# Patient Record
Sex: Male | Born: 1965
Health system: Southern US, Community
[De-identification: ages and names within clinical notes are randomized; demographics above are authoritative.]

## PROBLEM LIST (undated history)

## (undated) DIAGNOSIS — I10 Essential (primary) hypertension: Secondary | ICD-10-CM

## (undated) HISTORY — PX: NO PAST SURGERIES: SHX2092

---

## 2005-05-25 ENCOUNTER — Emergency Department (HOSPITAL_COMMUNITY): Admission: EM | Admit: 2005-05-25 | Discharge: 2005-05-25 | Payer: Self-pay | Admitting: Family Medicine

## 2008-06-20 ENCOUNTER — Emergency Department (HOSPITAL_COMMUNITY): Admission: EM | Admit: 2008-06-20 | Discharge: 2008-06-20 | Payer: Self-pay | Admitting: Emergency Medicine

## 2017-10-20 ENCOUNTER — Ambulatory Visit (HOSPITAL_COMMUNITY)
Admission: EM | Admit: 2017-10-20 | Discharge: 2017-10-20 | Disposition: A | Discharge status: Home / Self Care | Payer: Self-pay | Attending: Radiology | Admitting: Radiology

## 2017-10-20 ENCOUNTER — Encounter (HOSPITAL_COMMUNITY): Payer: Self-pay | Admitting: Emergency Medicine

## 2017-10-20 DIAGNOSIS — J069 Acute upper respiratory infection, unspecified: Secondary | ICD-10-CM

## 2017-10-20 HISTORY — DX: Essential (primary) hypertension: I10

## 2017-10-20 MED ORDER — DEXAMETHASONE SODIUM PHOSPHATE 10 MG/ML IJ SOLN
INTRAMUSCULAR | Status: AC
  Filled 2017-10-20: qty 1

## 2017-10-20 MED ORDER — BENZONATATE 100 MG PO CAPS: 100.0000 mg | ORAL_CAPSULE | Freq: Three times a day (TID) | ORAL | 0 refills | Status: DC

## 2017-10-20 MED ORDER — AMOXICILLIN-POT CLAVULANATE 875-125 MG PO TABS: 1.0000 | ORAL_TABLET | Freq: Two times a day (BID) | ORAL | 0 refills | Status: DC

## 2017-10-20 MED ORDER — DEXAMETHASONE SODIUM PHOSPHATE 10 MG/ML IJ SOLN
10.0000 mg | Freq: Once | INTRAMUSCULAR | Status: AC
  Administered 2017-10-20: 10 mg via INTRAMUSCULAR

## 2017-10-20 NOTE — ED Triage Notes (Signed)
Pt sts URI sx x 1 week with cough and congestion; pt requesting work note; pt with hx of htn not taking meds currently

## 2017-10-20 NOTE — ED Provider Notes (Signed)
[facility]    CSN: [id number] Arrival date & time: 10/20/17  1209     History   Chief Complaint Chief Complaint  Patient presents with  . URI    HPI David Pennington is a 51 y.o. male.   51 y.o. male presents with URI symptoms, cough, congestion, headache and subjective fever X 4 days.  Condition is acute in nature. Condition is made better by nothing. Condition is made worse by laying down. Patient denies any relief from otc medications taken prior to there arrival at this facility. Patient denies any nausea, vomitting, diarrhea or sick contacts      Past Medical History:  Diagnosis Date  . Hypertension     There are no active problems to display for this patient.   History reviewed. No pertinent surgical history.     Home Medications    Prior to Admission medications   Not on File    Family History History reviewed. No pertinent family history.  Social History Social History   Tobacco Use  . Smoking status: Unknown If Ever Smoked  . Smokeless tobacco: Never Used  Substance Use Topics  . Alcohol use: No    Frequency: Never  . Drug use: No     Allergies   Patient has no known allergies.   Review of Systems Review of Systems  Constitutional: Positive for fever. Negative for chills.  HENT: Positive for congestion. Negative for ear pain and sore throat.   Eyes: Negative for pain and visual disturbance.  Respiratory: Positive for cough ( ). Negative for shortness of breath.   Cardiovascular: Negative for chest pain and palpitations.  Gastrointestinal: Negative for abdominal pain and vomiting.  Genitourinary: Negative for dysuria and hematuria.  Musculoskeletal: Negative for arthralgias and back pain.  Skin: Negative for color change and rash.  Neurological: Positive for headaches. Negative for seizures and syncope.  All other systems reviewed and are negative.    Physical Exam Triage Vital Signs ED Triage Vitals [10/20/17 1243]   Enc Vitals Group     BP (!) 207/101     Pulse Rate 65     Resp 18     Temp 98.2 F (36.8 C)     Temp Source Oral     SpO2 99 %     Weight      Height      Head Circumference      Peak Flow      Pain Score      Pain Loc      Pain Edu?      Excl. in [place]?    No data found.  Updated Vital Signs BP (!) 207/101 (BP Location: Left Arm)   Pulse 65   Temp 98.2 F (36.8 C) (Oral)   Resp 18   SpO2 99%   Visual Acuity Right Eye Distance:   Left Eye Distance:   Bilateral Distance:    Right Eye Near:   Left Eye Near:    Bilateral Near:     Physical Exam  Constitutional: He is oriented to person, place, and time. He appears well-developed and well-nourished.  HENT:  Head: Normocephalic.  erthyema noted to pillars, congested noted to bilateral nares.   Neck: Normal range of motion.  Cardiovascular: Normal rate and regular rhythm.  Pulmonary/Chest: Effort normal.  Musculoskeletal: Normal range of motion.  Neurological: He is alert and oriented to person, place, and time.  Skin: Skin is dry.  Psychiatric: He has a normal mood  and affect.  Nursing note and vitals reviewed.    UC Treatments / Results  Labs (all labs ordered are listed, but only abnormal results are displayed) Labs Reviewed - No data to display  EKG  EKG Interpretation None       Radiology No results found.  Procedures Procedures (including critical care time)  Medications Ordered in UC Medications  dexamethasone (DECADRON) injection 10 mg (not administered)     Initial Impression / Assessment and Plan / UC Course  I have reviewed the triage vital signs and the nursing notes.  Pertinent labs & imaging results that were available during my care of the patient were reviewed by me and considered in my medical decision making (see chart for details).       Final Clinical Impressions(s) / UC Diagnoses   Final diagnoses:  None    ED Discharge Orders    None       Controlled  Substance Prescriptions [place] Controlled Substance Registry consulted? Not Applicable   Jacqualine Mau, NP 10/20/17 1324

## 2017-10-20 NOTE — Discharge Instructions (Signed)
Continue to push fluids and take over the counter medications as directed on the back of the box for symptomatic relief.  Start antibiotics if no better in 3-4 days.

## 2019-03-31 ENCOUNTER — Encounter (HOSPITAL_COMMUNITY): Payer: Self-pay

## 2019-03-31 ENCOUNTER — Other Ambulatory Visit: Payer: Self-pay

## 2019-03-31 ENCOUNTER — Ambulatory Visit (HOSPITAL_COMMUNITY)
Admission: EM | Admit: 2019-03-31 | Discharge: 2019-03-31 | Disposition: A | Discharge status: Home / Self Care | Payer: PRIVATE HEALTH INSURANCE | Attending: Family Medicine | Admitting: Family Medicine

## 2019-03-31 DIAGNOSIS — R1031 Right lower quadrant pain: Secondary | ICD-10-CM

## 2019-03-31 DIAGNOSIS — R1032 Left lower quadrant pain: Secondary | ICD-10-CM

## 2019-03-31 MED ORDER — DICYCLOMINE HCL 20 MG PO TABS: 20.0000 mg | ORAL_TABLET | Freq: Two times a day (BID) | ORAL | 0 refills | Status: DC

## 2019-03-31 NOTE — Discharge Instructions (Signed)
No alarming signs on exam. Keep hydrated, urine should be clear to pale yellow in color. Decrease milk intake. If starting to have diarrheal like bowel movement, can try bentyl to see if it helps with symptoms. If symptoms continues without worsening, follow up with PCP for further evaluation. If noticing worsening symptoms, worsening abdominal pain, unable to walk or jump due to pain, nausea/vomiting, fever, go to the emergency department for further evaluation needed.

## 2019-03-31 NOTE — ED Provider Notes (Signed)
[place]    CSN: [id number] Arrival date & time: 03/31/19  1210     History   Chief Complaint Chief Complaint  Patient presents with  . Abdominal Pain    HPI David Pennington is a 53 y.o. male.   53 year old male comes in for 6-day history of bilateral lower abdominal pain.  Pain is intermittent, cramping in sensation.  States that usually last about 5 to 10 minutes.  Oral intake can exacerbate the pain, and putting pressure to the abdomen can relieve the pain.  Has had decreased appetite without nausea or vomiting.  States still able to eat and drink despite the pain.  Denies fever, chills, night sweats.  Denies urinary symptoms such as frequency, dysuria, hematuria.  History of stomach ulcers, and does not take any NSAIDs.  He denies melena, hematochezia.  He has frequent alcohol use, but has decreased due to recent changes in work schedule.  He has also been drinking more milk than normal.  He denies history of abdominal surgeries.  Has daily bowel movements without obvious straining.  States stools are slightly looser today, but no obvious diarrhea.     Past Medical History:  Diagnosis Date  . Hypertension     There are no active problems to display for this patient.   History reviewed. No pertinent surgical history.     Home Medications    Prior to Admission medications   Medication Sig Start Date End Date Taking? Authorizing Provider  dicyclomine (BENTYL) 20 MG tablet Take 1 tablet (20 mg total) by mouth 2 (two) times daily. 03/31/19   Ok Edwards, PA-C    Family History History reviewed. No pertinent family history.  Social History Social History   Tobacco Use  . Smoking status: Unknown If Ever Smoked  . Smokeless tobacco: Never Used  Substance Use Topics  . Alcohol use: No    Frequency: Never  . Drug use: No     Allergies   Patient has no known allergies.   Review of Systems Review of Systems  Reason unable to perform ROS: See HPI as  above.     Physical Exam Triage Vital Signs ED Triage Vitals  Enc Vitals Group     BP 03/31/19 1221 (!) 161/115     Pulse Rate 03/31/19 1221 77     Resp 03/31/19 1221 20     Temp 03/31/19 1221 98 F (36.7 C)     Temp Source 03/31/19 1221 Oral     SpO2 03/31/19 1221 98 %     Weight --      Height --      Head Circumference --      Peak Flow --      Pain Score 03/31/19 1223 5     Pain Loc --      Pain Edu? --      Excl. in [place]? --    No data found.  Updated Vital Signs BP (!) 161/115   Pulse 77   Temp 98 F (36.7 C) (Oral)   Resp 20   SpO2 98%   Physical Exam Constitutional:      General: He is not in acute distress.    Appearance: He is well-developed.  HENT:     Head: Normocephalic and atraumatic.  Cardiovascular:     Rate and Rhythm: Normal rate and regular rhythm.     Heart sounds: Normal heart sounds. No murmur. No friction rub. No gallop.   Pulmonary:  Effort: Pulmonary effort is normal.     Breath sounds: Normal breath sounds. No wheezing or rales.  Abdominal:     General: Bowel sounds are normal.     Palpations: Abdomen is soft.     Tenderness: There is no abdominal tenderness. There is no right CVA tenderness, left CVA tenderness, guarding or rebound.  Skin:    General: Skin is warm and dry.  Neurological:     Mental Status: He is alert and oriented to person, place, and time.  Psychiatric:        Behavior: Behavior normal.        Judgment: Judgment normal.      UC Treatments / Results  Labs (all labs ordered are listed, but only abnormal results are displayed) Labs Reviewed - No data to display  EKG None  Radiology No results found.  Procedures Procedures (including critical care time)  Medications Ordered in UC Medications - No data to display  Initial Impression / Assessment and Plan / UC Course  I have reviewed the triage vital signs and the nursing notes.  Pertinent labs & imaging results that were available during my care  of the patient were reviewed by me and considered in my medical decision making (see chart for details).    Abdomen nontender to palpation.  Will have patient continue to monitor symptoms, decrease milk intake.  Push fluids.  If continues with looser stools, or develop diarrhea, can start Bentyl for symptomatic relief.  Return precautions given.  Patient expresses understanding and agrees to plan.  Final Clinical Impressions(s) / UC Diagnoses   Final diagnoses:  Bilateral lower abdominal pain    ED Prescriptions    Medication Sig Dispense Auth. Provider   dicyclomine (BENTYL) 20 MG tablet Take 1 tablet (20 mg total) by mouth 2 (two) times daily. 20 tablet Tobin Chad, [place] 03/31/19 1305

## 2019-03-31 NOTE — ED Triage Notes (Signed)
C/o lower abdominal pain since last Wednesday, pain is intermittent, denies N/V , diarrhea or constipation

## 2019-04-17 ENCOUNTER — Ambulatory Visit (INDEPENDENT_AMBULATORY_CARE_PROVIDER_SITE_OTHER): Payer: PRIVATE HEALTH INSURANCE

## 2019-04-17 ENCOUNTER — Other Ambulatory Visit: Payer: Self-pay

## 2019-04-17 ENCOUNTER — Ambulatory Visit (HOSPITAL_COMMUNITY)
Admission: EM | Admit: 2019-04-17 | Discharge: 2019-04-17 | Disposition: A | Discharge status: Home / Self Care | Payer: PRIVATE HEALTH INSURANCE | Attending: Family Medicine | Admitting: Family Medicine

## 2019-04-17 ENCOUNTER — Encounter (HOSPITAL_COMMUNITY): Payer: Self-pay

## 2019-04-17 DIAGNOSIS — R1032 Left lower quadrant pain: Secondary | ICD-10-CM | POA: Diagnosis present

## 2019-04-17 DIAGNOSIS — R319 Hematuria, unspecified: Secondary | ICD-10-CM

## 2019-04-17 DIAGNOSIS — R634 Abnormal weight loss: Secondary | ICD-10-CM | POA: Diagnosis not present

## 2019-04-17 LAB — COMPREHENSIVE METABOLIC PANEL
ALT: 17 U/L (ref 0–44)
AST: 20 U/L (ref 15–41)
Albumin: 4.5 g/dL (ref 3.5–5.0)
Alkaline Phosphatase: 54 U/L (ref 38–126)
Anion gap: 12 (ref 5–15)
BUN: 7 mg/dL (ref 6–20)
CO2: 29 mmol/L (ref 22–32)
Calcium: 10.4 mg/dL — ABNORMAL HIGH (ref 8.9–10.3)
Chloride: 97 mmol/L — ABNORMAL LOW (ref 98–111)
Creatinine, Ser: 1.1 mg/dL (ref 0.61–1.24)
GFR calc Af Amer: >60 mL/min (ref >60)
GFR calc non Af Amer: >60 mL/min (ref >60)
Glucose, Bld: 100 mg/dL — ABNORMAL HIGH (ref 70–99)
Potassium: 4.4 mmol/L (ref 3.5–5.1)
Sodium: 138 mmol/L (ref 135–145)
Total Bilirubin: 0.4 mg/dL (ref 0.3–1.2)
Total Protein: 8 g/dL (ref 6.5–8.1)

## 2019-04-17 LAB — POCT URINALYSIS DIP (DEVICE)
Bilirubin Urine: NEGATIVE
Glucose, UA: NEGATIVE mg/dL
Ketones, ur: NEGATIVE mg/dL
Leukocytes,Ua: NEGATIVE
Nitrite: NEGATIVE
Protein, ur: 30 mg/dL — AB
Specific Gravity, Urine: 1.02 (ref 1.005–1.030)
Urobilinogen, UA: 0.2 mg/dL (ref 0.0–1.0)
pH: 6 (ref 5.0–8.0)

## 2019-04-17 LAB — CBC
HCT: 45.8 % (ref 39.0–52.0)
Hemoglobin: 15.9 g/dL (ref 13.0–17.0)
MCH: 32.6 pg (ref 26.0–34.0)
MCHC: 34.7 g/dL (ref 30.0–36.0)
MCV: 93.9 fL (ref 80.0–100.0)
Platelets: 317 10*3/uL (ref 150–400)
RBC: 4.88 MIL/uL (ref 4.22–5.81)
RDW: 13.2 % (ref 11.5–15.5)
WBC: 5.7 10*3/uL (ref 4.0–10.5)
nRBC: 0 % (ref 0.0–0.2)

## 2019-04-17 LAB — AMYLASE: Amylase: 92 U/L (ref 28–100)

## 2019-04-17 MED ORDER — POLYETHYLENE GLYCOL 3350 17 G PO PACK: 17.0000 g | PACK | Freq: Every day | ORAL | 0 refills | Status: DC

## 2019-04-17 NOTE — Discharge Instructions (Addendum)
You need to drink more water Stop drinking milk.  Switch to a nondairy milk like almond milk, or try Lactaid milk Try to reduce your drinking of soda I am prescribing a medicine to soften your stool so you will have regular bowel movements and not become constipated You need to follow-up with your primary care doctor for preventative medicine.  I have also given you the phone number of a gastroenterology (stomach) Dr.

## 2019-04-17 NOTE — ED Triage Notes (Signed)
Pt states that he is still having abdominal pain with no improvement since last visit

## 2019-04-17 NOTE — ED Provider Notes (Signed)
[place]    CSN: [id number] Arrival date & time: 04/17/19  1117     History   Chief Complaint Chief Complaint  Patient presents with  . Abdominal Pain    HPI David Pennington is a 53 y.o. male.   HPI    This is a pleasant 53 year old gentleman who works at [employer] and [employer].  He is here for follow-up of abdominal pain.  He was seen here 03/31/2019 for lower abdominal pain.  He is here for ongoing pain.  Is been more than a month.  He states he is lost weight.  He feels like his appetite is okay.  He has not any alcohol for the last month.  He does drink a lot of soda He does drink a lot of milk.  He tried to cut back on his milk.  He does not drink any water.  He is a single man.  He states he eats a lot of fried food Cannot feel that there is any relationship to eating or food.  He states that he has more pain during his waking hours and when he is sleeping.  The pain does not awaken him at night.  First he told me that he had regular bowel movements, but then he told me he has constipation and occasionally takes laxatives.  Blood in bowels.  No nausea or vomiting.  No fever or chills. He is 53 years old he does not get any regular medical care.  He has not had any preventative medicine or cancer screening.  He has not had a colonoscopy.  He denies that there is any colon cancer in his family. He states he smokes cigarettes. He does not have any kidney stones or kidney pain.  No problem with urination.  He is not noted any blood in his urine.   Past Medical History:  Diagnosis Date  . Hypertension     There are no active problems to display for this patient.   History reviewed. No pertinent surgical history.     Home Medications    Prior to Admission medications   Medication Sig Start Date End Date Taking? Authorizing Provider  polyethylene glycol (MIRALAX / GLYCOLAX) 17 g packet Take 17 g by mouth daily. 04/17/19   Raylene Everts, MD    Family History  History reviewed. No pertinent family history.  Social History Social History   Tobacco Use  . Smoking status: Unknown If Ever Smoked  . Smokeless tobacco: Never Used  Substance Use Topics  . Alcohol use: No    Frequency: Never  . Drug use: No     Allergies   Patient has no known allergies.   Review of Systems Review of Systems  Constitutional: Negative for chills and fever.  HENT: Negative for ear pain and sore throat.   Eyes: Negative for pain and visual disturbance.  Respiratory: Negative for cough and shortness of breath.   Cardiovascular: Negative for chest pain and palpitations.  Gastrointestinal: Positive for abdominal pain and constipation. Negative for vomiting.  Genitourinary: Negative for difficulty urinating, dysuria and hematuria.  Musculoskeletal: Negative for arthralgias, back pain and myalgias.  Skin: Negative for color change and rash.  Neurological: Negative for seizures, syncope, weakness and headaches.  All other systems reviewed and are negative.    Physical Exam Triage Vital Signs ED Triage Vitals  Enc Vitals Group     BP 04/17/19 1215 (!) 151/94     Pulse Rate 04/17/19 1215 70  Resp --      Temp 04/17/19 1215 98.2 F (36.8 C)     Temp src --      SpO2 04/17/19 1215 100 %     Weight --      Height --      Head Circumference --      Peak Flow --      Pain Score 04/17/19 1216 5     Pain Loc --      Pain Edu? --      Excl. in [place]? --    No data found.  Updated Vital Signs BP (!) 151/94   Pulse 70   Temp 98.2 F (36.8 C)   SpO2 100%         Physical Exam Constitutional:      General: He is not in acute distress.    Appearance: He is well-developed and normal weight.     Comments: Appears thin  HENT:     Head: Normocephalic and atraumatic.     Mouth/Throat:     Mouth: Mucous membranes are moist.  Eyes:     Extraocular Movements: Extraocular movements intact.     Conjunctiva/sclera: Conjunctivae normal.     Pupils: Pupils  are equal, round, and reactive to light.  Neck:     Musculoskeletal: Normal range of motion.  Cardiovascular:     Rate and Rhythm: Normal rate and regular rhythm.     Heart sounds: Normal heart sounds.  Pulmonary:     Effort: Pulmonary effort is normal. No respiratory distress.     Breath sounds: Normal breath sounds.  Abdominal:     General: Abdomen is flat and scaphoid. Bowel sounds are normal. There is no distension.     Palpations: Abdomen is soft. There is no hepatomegaly or splenomegaly.     Tenderness: There is abdominal tenderness in the left lower quadrant. There is no guarding or rebound.     Hernia: No hernia is present.  Musculoskeletal: Normal range of motion.  Skin:    General: Skin is warm and dry.  Neurological:     General: No focal deficit present.     Mental Status: He is alert.  Psychiatric:        Mood and Affect: Mood normal.        Behavior: Behavior normal.    Tenderness to palpation in the deep left abdomen.  No masses palpable.  UC Treatments / Results  Labs (all labs ordered are listed, but only abnormal results are displayed) Labs Reviewed  COMPREHENSIVE METABOLIC PANEL - Abnormal; Notable for the following components:      Result Value   Chloride 97 (*)    Glucose, Bld 100 (*)    Calcium 10.4 (*)    All other components within normal limits  POCT URINALYSIS DIP (DEVICE) - Abnormal; Notable for the following components:   Hgb urine dipstick LARGE (*)    Protein, ur 30 (*)    All other components within normal limits  CBC  AMYLASE    EKG None  Radiology Dg Abdomen 1 View  Result Date: 04/17/2019 CLINICAL DATA:  Per pt: mid stomach pain x 2 weeks. Not getting any better. No previous injury or surgery. ab pain X1 month, LLQ, weight loss EXAM: ABDOMEN - 1 VIEW COMPARISON:  None. FINDINGS: No dilated loops of large or small bowel. High-density material in the RIGHT lower quadrant is favored stool contents. No pathologic calcifications. No  organomegaly. No aggressive osseous lesion. No intraperitoneal  free air. IMPRESSION: No acute abdominal findings.  No bowel obstruction. Electronically Signed   By: Suzy Bouchard M.D.   On: 04/17/2019 13:25    Procedures Procedures (including critical care time)  Medications Ordered in UC Medications - No data to display  Initial Impression / Assessment and Plan / UC Course  I have reviewed the triage vital signs and the nursing notes.  Pertinent labs & imaging results that were available during my care of the patient were reviewed by me and considered in my medical decision making (see chart for details).      I discussed with the patient that I did not find any abnormalities to explain his recurrent abdominal pain. The fact he is having crampy abdominal pain, intermittent constipation, and loss of weight is very concerning.  Especially since he has had no colon cancer screening and he is a smoker.  I believe he needs additional evaluation and possible CAT scan.  We discussed that he needed BCP.  He needs referral to GI. In addition the patient has hematuria.  This is not expected.  This does need to be followed up and evaluated. Final Clinical Impressions(s) / UC Diagnoses   Final diagnoses:  Left lower quadrant abdominal pain  Loss of weight  Hematuria, unspecified type     Discharge Instructions     You need to drink more water Stop drinking milk.  Switch to a nondairy milk like almond milk, or try Lactaid milk Try to reduce your drinking of soda I am prescribing a medicine to soften your stool so you will have regular bowel movements and not become constipated You need to follow-up with your primary care doctor for preventative medicine.  I have also given you the phone number of a gastroenterology (stomach) Dr.    ED Prescriptions    Medication Sig Dispense Auth. Provider   polyethylene glycol (MIRALAX / GLYCOLAX) 17 g packet Take 17 g by mouth daily. 2 each Raylene Everts, MD     Controlled Substance Prescriptions [place] Controlled Substance Registry consulted? Not Applicable   Raylene Everts, MD 04/17/19 [phone]

## 2019-05-07 ENCOUNTER — Emergency Department (HOSPITAL_COMMUNITY)
Admission: EM | Admit: 2019-05-07 | Discharge: 2019-05-07 | Disposition: A | Discharge status: Home / Self Care | Payer: PRIVATE HEALTH INSURANCE | Attending: Emergency Medicine | Admitting: Emergency Medicine

## 2019-05-07 ENCOUNTER — Encounter (HOSPITAL_COMMUNITY): Payer: Self-pay

## 2019-05-07 ENCOUNTER — Other Ambulatory Visit: Payer: Self-pay

## 2019-05-07 DIAGNOSIS — R1084 Generalized abdominal pain: Secondary | ICD-10-CM | POA: Diagnosis present

## 2019-05-07 DIAGNOSIS — I10 Essential (primary) hypertension: Secondary | ICD-10-CM | POA: Diagnosis not present

## 2019-05-07 MED ORDER — HYDROCHLOROTHIAZIDE 12.5 MG PO TABS: 12.5000 mg | ORAL_TABLET | Freq: Every day | ORAL | 0 refills | Status: DC

## 2019-05-07 MED ORDER — SODIUM CHLORIDE 0.9% FLUSH: 3.0000 mL | Freq: Once | INTRAVENOUS | Status: DC

## 2019-05-07 NOTE — ED Notes (Signed)
Patient left ED prior to discharge paperwork, but was told he was being discharged.  Patient left.  Paperwork placed at front  To be pciked up.

## 2019-05-07 NOTE — Discharge Instructions (Signed)
Please read attached information. If you experience any new or worsening signs or symptoms please return to the emergency room for evaluation. Please follow-up with your primary care provider or specialist as discussed. Please use medication prescribed only as directed and discontinue taking if you have any concerning signs or symptoms.   °

## 2019-05-07 NOTE — ED Provider Notes (Signed)
[facility]PART[facility] Provider Note   CSN: [id number] Arrival date & time: 05/07/19  1121    History   Chief Complaint Chief Complaint  Patient presents with  . Abdominal Pain    HPI David Pennington is a 53 y.o. male.     HPI   53 year old male presents today with complaints of abdominal pain.  He notes that over the last month he has had intermittent lower abdominal pain.  He notes this is crampy in nature.  He notes that sometimes when he drinks water or eats food the pain comes on and lasts approximately 10 minutes.  He notes complete resolution of symptoms.  He denies any associated nausea vomiting fever diarrhea or constipation.  He notes no urinary symptoms.  He notes that this is not every time he eats or drinks but is an occasional.  He denies any history of the same.  He denies any pain presently, no pain with palpation of his abdomen.  He reports he has been seen before and placed on Bentyl which did not improve his symptoms.  He is attempted to follow-up with gastroenterology but does not have a smart phone so he cannot participate in the visit.  Notes a history of hypertension but has run out of the medications that were given to him.  He denies any chest pain vision changes headache or any other blood pressure related concerns.  Past Medical History:  Diagnosis Date  . Hypertension     There are no active problems to display for this patient.   No past surgical history on file.      Home Medications    Prior to Admission medications   Medication Sig Start Date End Date Taking? Authorizing Provider  hydrochlorothiazide (HYDRODIURIL) 12.5 MG tablet Take 1 tablet (12.5 mg total) by mouth daily. 05/07/19   Arad Burston, Dellis Filbert, PA-C  polyethylene glycol (MIRALAX / GLYCOLAX) 17 g packet Take 17 g by mouth daily. 04/17/19   Raylene Everts, MD    Family History No family history on file.  Social History Social History   Tobacco Use  .  Smoking status: Unknown If Ever Smoked  . Smokeless tobacco: Never Used  Substance Use Topics  . Alcohol use: No    Frequency: Never  . Drug use: No     Allergies   Patient has no known allergies.   Review of Systems Review of Systems  All other systems reviewed and are negative.    Physical Exam Updated Vital Signs BP (!) 180/71 (BP Location: Right Arm)   Pulse 77   Temp 98 F (36.7 C) (Oral)   Resp 20   Ht 6\' 5"  (1.956 m)   Wt 74.8 kg   SpO2 100%   BMI 19.57 kg/m   Physical Exam Vitals signs and nursing note reviewed.  Constitutional:      Appearance: He is well-developed.  HENT:     Head: Normocephalic and atraumatic.  Eyes:     General: No scleral icterus.       Right eye: No discharge.        Left eye: No discharge.     Conjunctiva/sclera: Conjunctivae normal.     Pupils: Pupils are equal, round, and reactive to light.  Neck:     Musculoskeletal: Normal range of motion.     Vascular: No JVD.     Trachea: No tracheal deviation.  Pulmonary:     Effort: Pulmonary effort is normal.  Breath sounds: No stridor.  Abdominal:     Comments: Abdomen is soft nontender with no distention or masses  Neurological:     Mental Status: He is alert and oriented to person, place, and time.     Coordination: Coordination normal.  Psychiatric:        Behavior: Behavior normal.        Thought Content: Thought content normal.        Judgment: Judgment normal.      ED Treatments / Results  Labs (all labs ordered are listed, but only abnormal results are displayed) Labs Reviewed - No data to display  EKG None  Radiology No results found.  Procedures Procedures (including critical care time)  Medications Ordered in ED Medications - No data to display   Initial Impression / Assessment and Plan / ED Course  I have reviewed the triage vital signs and the nursing notes.  Pertinent labs & imaging results that were available during my care of the patient  were reviewed by me and considered in my medical decision making (see chart for details).      Assessment/Plan: 53 year old male presents today with abdominal pain.  Unknown certain etiology at this time.  I have low suspicion for acute life-threatening etiology including vascular, infection, or malignancy.  Question irritable bowel.  Patient also hypertensive here, he will be placed back on HCTZ, labs from earlier in the month assuring.  Patient will continue outpatient gastroenterology follow-up, follow-up with primary care provider and return immediately if develops any new or worsening signs or symptoms.  He verbalized understanding and agreement to today's plan had no further questions or concerns.  Patient also requesting a return to work note.  He notes because of his abdominal pain they have kept him out with concerns for COVID.  He denies any concerns related to coronavirus      Final Clinical Impressions(s) / ED Diagnoses   Final diagnoses:  Generalized abdominal pain  Hypertension, unspecified type    ED Discharge Orders         Ordered    hydrochlorothiazide (HYDRODIURIL) 12.5 MG tablet  Daily     05/07/19 1145           Okey Regal, PA-C 05/07/19 1147    Veryl Speak, MD 05/07/19 1447

## 2019-05-07 NOTE — ED Triage Notes (Signed)
Pt presents to ED with bilateral lower abdominal x1 month.  Pt reports being seen at [facility] x2 and they gave him information to a GI specialist who hasn't yet made an appointment.    Today pt reports that the pain is intermittent and has resolved at time of triage.  Pt denies N/V/D and constipation.

## 2019-05-27 ENCOUNTER — Other Ambulatory Visit: Payer: Self-pay

## 2019-05-27 ENCOUNTER — Ambulatory Visit (INDEPENDENT_AMBULATORY_CARE_PROVIDER_SITE_OTHER): Payer: PRIVATE HEALTH INSURANCE | Admitting: Gastroenterology

## 2019-05-27 ENCOUNTER — Encounter: Payer: Self-pay | Admitting: Gastroenterology

## 2019-05-27 VITALS — Ht 77.0 in | Wt 165.0 lb

## 2019-05-27 DIAGNOSIS — R1030 Lower abdominal pain, unspecified: Secondary | ICD-10-CM

## 2019-05-27 MED ORDER — NA SULFATE-K SULFATE-MG SULF 17.5-3.13-1.6 GM/177ML PO SOLN: 1.0000 | ORAL | 0 refills | Status: AC

## 2019-05-27 NOTE — Progress Notes (Signed)
TELEHEALTH VISIT  Referring Provider: No ref. provider found Primary Care Physician:  System, Provider Not In   Tele-visit due to COVID-19 pandemic Patient requested visit virtually, consented to the virtual encounter via audio enabled telemedicine application (failed attempt at [place], failed attempt at video [place], converted to audio) Contact made at: 16:16 05/27/19 Patient verified by name and date of birth Location of patient: Niece's house Location provider: [facility] Names of persons participating: Me, patient, David Pennington CMA Time spent on telehealth visit: 30 minutes I discussed the limitations of evaluation and management by telemedicine. The patient expressed understanding and agreed to proceed.  Reason for Consultation:  Abdominal pain   IMPRESSION:  Lower abdominal pain    - negative Carnett's sign (completed during telehealth encounter) No prior colon cancer screening  No known family history of colon cancer or polyps  Etiology of abdominal pain is unclear. Abdominal wall pain must be considered.  Assessment is limited by the nature of the virtual encounter.   PLAN: Repeat calcium and PTH Colonoscopy  CT of the abd/pelvis with contrast if the colonoscopy is non-diagnostic Work excuse provided for colonoscopy Recommend that he establish care with a PCP for further evaluation of hematuria  I consented the patient discussing the risks, benefits, and alternatives to endoscopic evaluation. In particular, we discussed the risks that include, but are not limited to, reaction to medication, cardiopulmonary compromise, bleeding requiring blood transfusion, aspiration resulting in pneumonia, perforation requiring surgery, lack of diagnosis, severe illness requiring hospitalization, and even death. We reviewed the risk of missed lesion including polyps or even cancer. The patient acknowledges these risks and asks that we proceed.   HPI: David Pennington is a  53 y.o. male seen in the ED 03/31/19 and 05/07/19 for a one month history of intermittent (5-10 minutes), crampy, non-radiating lower RLQ and LLQ abdominal pain. Associated borborygmous. No constipation, diarrhea, change in bowel habits, or blood in the stool. Holding the area with his fist will provide relief.  Worsened by oral intake and movement. Improved with defecation.  He works third shift. Tingles in the day but does not experience true abdominal pain until he heads to work for the third shift.  On his feet working at [employer]. Good appetite. Some early satiety. Unintentional weight loss of 2-3 pounds.   Abdominal xray 04/17/19: no acute findings. Stool present.  Labs 04/17/19: normal CMP except for calcium of 10.4, normal CBC, UA with blood and protein No change with Bentyl.  No prior colonoscopy or colon cancer screening.   No known family history of colon cancer or polyps. No family history of uterine/endometrial cancer, pancreatic cancer or gastric/stomach cancer.  Past Medical History:  Diagnosis Date  . Hypertension     No past surgical history on file.  Current Outpatient Medications  Medication Sig Dispense Refill  . hydrochlorothiazide (HYDRODIURIL) 12.5 MG tablet Take 1 tablet (12.5 mg total) by mouth daily. 30 tablet 0  . polyethylene glycol (MIRALAX / GLYCOLAX) 17 g packet Take 17 g by mouth daily. 14 each 0   No current facility-administered medications for this visit.     Allergies as of 05/27/2019  . (No Known Allergies)    No family history on file.  Social History   Socioeconomic History  . Marital status: Single    Spouse name: Not on file  . Number of children: Not on file  . Years of education: Not on file  . Highest education level: Not on  file  Occupational History  . Not on file  Social Needs  . Financial resource strain: Not on file  . Food insecurity    Worry: Not on file    Inability: Not on file  . Transportation needs    Medical:  Not on file    Non-medical: Not on file  Tobacco Use  . Smoking status: Current Every Day Smoker    Packs/day: 1.00    Types: Cigarettes  . Smokeless tobacco: Never Used  Substance and Sexual Activity  . Alcohol use: Yes    Frequency: Never    Comment: occasionally  . Drug use: No  . Sexual activity: Not on file  Lifestyle  . Physical activity    Days per week: Not on file    Minutes per session: Not on file  . Stress: Not on file  Relationships  . Social Herbalist on phone: Not on file    Gets together: Not on file    Attends religious service: Not on file    Active member of club or organization: Not on file    Attends meetings of clubs or organizations: Not on file    Relationship status: Not on file  . Intimate partner violence    Fear of current or ex partner: Not on file    Emotionally abused: Not on file    Physically abused: Not on file    Forced sexual activity: Not on file  Other Topics Concern  . Not on file  Social History Narrative  . Not on file    Review of Systems: ALL ROS discussed and all others negative except listed in HPI.  Physical Exam: Complete physical exam not performed due to the limits inherent in a telehealth encounter.  General: Awake, alert, and oriented, and well communicative. In no acute distress.  Pulm: No labored breathing, speaking in full sentences without conversational dyspnea  Psych: Pleasant, cooperative, normal speech, normal affect and normal insight Neuro: Alert and appropriate Carnett sign is negative as instructed did not cause any pain.    David Pennington L. Tarri Glenn, MD, MPH [facility] 05/27/2019, 11:35 AM

## 2019-05-30 ENCOUNTER — Telehealth: Payer: Self-pay | Admitting: Gastroenterology

## 2019-05-30 NOTE — Telephone Encounter (Signed)

## 2019-06-02 ENCOUNTER — Ambulatory Visit: Payer: PRIVATE HEALTH INSURANCE | Admitting: Gastroenterology

## 2019-06-02 ENCOUNTER — Other Ambulatory Visit: Payer: Self-pay

## 2019-06-02 ENCOUNTER — Encounter: Payer: Self-pay | Admitting: Gastroenterology

## 2019-06-02 ENCOUNTER — Encounter: Payer: Self-pay | Admitting: *Deleted

## 2019-06-02 VITALS — BP 155/83 | HR 65 | Temp 98.5°F | Ht 77.0 in | Wt 165.0 lb

## 2019-06-02 MED ORDER — NA SULFATE-K SULFATE-MG SULF 17.5-3.13-1.6 GM/177ML PO SOLN: 1.0000 | Freq: Once | ORAL | 0 refills | Status: AC

## 2019-06-02 MED ORDER — SODIUM CHLORIDE 0.9 % IV SOLN: 500.0000 mL | Freq: Once | INTRAVENOUS | Status: DC

## 2019-06-02 NOTE — Progress Notes (Signed)
Patient consumed chicken and rice yesterday afternoon at 2:00 pm.  Soft mushy/liquid brown stool. Per Dr. Tarri Glenn patient will be rescheduled.

## 2019-06-02 NOTE — Progress Notes (Signed)
Prep instructions printed out and reviewed with pt.prep sent into pharmacy.

## 2019-06-02 NOTE — Progress Notes (Signed)
David Pennington- temp [name]- vitals 

## 2019-07-02 ENCOUNTER — Encounter: Payer: PRIVATE HEALTH INSURANCE | Admitting: Gastroenterology

## 2019-07-15 ENCOUNTER — Telehealth: Payer: Self-pay | Admitting: Gastroenterology

## 2019-07-15 NOTE — Telephone Encounter (Signed)

## 2019-07-16 ENCOUNTER — Encounter: Payer: PRIVATE HEALTH INSURANCE | Admitting: Gastroenterology

## 2019-07-16 ENCOUNTER — Telehealth: Payer: Self-pay | Admitting: Gastroenterology

## 2019-07-16 NOTE — Telephone Encounter (Signed)
patient called in stating that he cannot afford prep and did not prep for todays procedure. patient states that his brother in law will pay for prep and rescheduled his procedure to 07/29/19. please resend prep rx to [facility] on [address]. This is the third time that patient has rescheduled.

## 2019-07-16 NOTE — Telephone Encounter (Signed)
If he does not keep his appointment on 07/29/19 he will need an office visit with me prior to rescheduling his colonoscopy again. Thank you.

## 2019-07-16 NOTE — Telephone Encounter (Signed)
We have samples of suprep I will leave one for him upfront! Left patient detailed voicemail.

## 2019-07-16 NOTE — Telephone Encounter (Signed)
If he does not keep his appointment on 07/29/19 he will need an office visit with me prior to rescheduling his colonoscopy again.

## 2019-07-16 NOTE — Telephone Encounter (Signed)
Desiree,  Do you mind sending in the prep rx? Thanks.

## 2019-07-28 ENCOUNTER — Telehealth: Payer: Self-pay | Admitting: Gastroenterology

## 2019-07-28 NOTE — Telephone Encounter (Signed)

## 2019-07-29 ENCOUNTER — Encounter: Payer: Self-pay | Admitting: Gastroenterology

## 2019-07-29 ENCOUNTER — Ambulatory Visit (AMBULATORY_SURGERY_CENTER): Payer: PRIVATE HEALTH INSURANCE | Admitting: Gastroenterology

## 2019-07-29 ENCOUNTER — Other Ambulatory Visit: Payer: Self-pay

## 2019-07-29 VITALS — BP 137/92 | HR 54 | Temp 98.2°F | Resp 18 | Ht 77.0 in | Wt 165.0 lb

## 2019-07-29 DIAGNOSIS — K621 Rectal polyp: Secondary | ICD-10-CM

## 2019-07-29 DIAGNOSIS — D128 Benign neoplasm of rectum: Secondary | ICD-10-CM

## 2019-07-29 NOTE — Op Note (Signed)
[facility] Patient Name: David Pennington Procedure Date: 07/29/2019 9:18 AM MRN: [id number] Endoscopist: Thornton Park MD, MD Age: 53 Referring MD:  Date of Birth: 1966-04-11 Gender: Male Account #: [id number] Procedure:                Colonoscopy Indications:              Abdominal pain in the left lower quadrant                           - negative Carnett's sign (completed during                            telehealth encounter)                           - now resolved                           No prior colon cancer screening                           No known family history of colon cancer or polyps Medicines:                See the Anesthesia note for documentation of the                            administered medications Procedure:                Pre-Anesthesia Assessment:                           - Prior to the procedure, a History and Physical                            was performed, and patient medications and                            allergies were reviewed. The patient's tolerance of                            previous anesthesia was also reviewed. The risks                            and benefits of the procedure and the sedation                            options and risks were discussed with the patient.                            All questions were answered, and informed consent                            was obtained. Prior Anticoagulants: The patient has                            taken  no previous anticoagulant or antiplatelet                            agents. ASA Grade Assessment: II - A patient with                            mild systemic disease. After reviewing the risks                            and benefits, the patient was deemed in                            satisfactory condition to undergo the procedure.                           After obtaining informed consent, the colonoscope                            was passed under direct vision.  Throughout the                            procedure, the patient's blood pressure, pulse, and                            oxygen saturations were monitored continuously. The                            Colonoscope was introduced through the anus and                            advanced to the the cecum, identified by                            appendiceal orifice and ileocecal valve. A second                            forward view of the right colon was performed. The                            colonoscopy was performed without difficulty.                            However, I was unable to intubate the terminal                            ileum despite numerous attempts. The patient                            tolerated the procedure well. The quality of the                            bowel preparation was good. The ileocecal valve,  appendiceal orifice, and rectum were photographed. Scope In: 9:29:47 AM Scope Out: 9:48:35 AM Scope Withdrawal Time: 0 hours 16 minutes 33 seconds  Total Procedure Duration: 0 hours 18 minutes 48 seconds  Findings:                 The perianal and digital rectal examinations were                            normal.                           A few small-mouthed diverticula were found in the                            sigmoid colon. There is no diverticulitis.                           A 2 mm polyp was found in the rectum. The polyp was                            sessile. The polyp was removed with a cold snare.                            Resection and retrieval were complete.                           The exam was otherwise without abnormality on                            direct and retroflexion views. Small internal                            hemorrhoids were present. Complications:            No immediate complications. Estimated blood loss:                            Minimal. Estimated Blood Loss:     Estimated blood loss was  minimal. Impression:               - Diverticulosis in the sigmoid colon.                           - One 2 mm polyp in the rectum, removed with a cold                            snare. Resected and retrieved.                           - The examination was otherwise normal on direct                            and retroflexion views. Source of recect abdominal                            pain  was not identified on this exam. Recommendation:           - Patient has a contact number available for                            emergencies. The signs and symptoms of potential                            delayed complications were discussed with the                            patient. Return to normal activities tomorrow.                            Written discharge instructions were provided to the                            patient.                           - High fiber diet today.                           - Continue present medications.                           - Await pathology results.                           - Repeat colonoscopy date to be determined after                            pending pathology results are reviewed for                            surveillance based on pathology results.                           - CT abd/pelvis with contrast with any recurrent                            abdominal pain. Thornton Park MD, MD 07/29/2019 9:59:02 AM This report has been signed electronically.

## 2019-07-29 NOTE — Progress Notes (Signed)
PT taken to PACU. Monitors in place. VSS. Report given to RN. 

## 2019-07-29 NOTE — Progress Notes (Signed)
Called to room to assist during endoscopic procedure.  Patient ID and intended procedure confirmed with present staff. Received instructions for my participation in the procedure from the performing physician.  

## 2019-07-29 NOTE — Patient Instructions (Signed)
Impression/Recommendations:  Diverticulosis handout given to patient. Polyp handout given to patient. High fiber diet handout given to patient.  Continue present medications. Await pathology results.  Repeat colonoscopy date to be determined after pathology results are received.  CT of abdomin/pelvis with contrast with any recurrent abdominal pain.  YOU HAD AN ENDOSCOPIC PROCEDURE TODAY AT [facility]:   Refer to the procedure report that was given to you for any specific questions about what was found during the examination.  If the procedure report does not answer your questions, please call your gastroenterologist to clarify.  If you requested that your care partner not be given the details of your procedure findings, then the procedure report has been included in a sealed envelope for you to review at your convenience later.  YOU SHOULD EXPECT: Some feelings of bloating in the abdomen. Passage of more gas than usual.  Walking can help get rid of the air that was put into your GI tract during the procedure and reduce the bloating. If you had a lower endoscopy (such as a colonoscopy or flexible sigmoidoscopy) you may notice spotting of blood in your stool or on the toilet paper. If you underwent a bowel prep for your procedure, you may not have a normal bowel movement for a few days.  Please Note:  You might notice some irritation and congestion in your nose or some drainage.  This is from the oxygen used during your procedure.  There is no need for concern and it should clear up in a day or so.  SYMPTOMS TO REPORT IMMEDIATELY:   Following lower endoscopy (colonoscopy or flexible sigmoidoscopy):  Excessive amounts of blood in the stool  Significant tenderness or worsening of abdominal pains  Swelling of the abdomen that is new, acute  Fever of 100F or higher For urgent or emergent issues, a gastroenterologist can be reached at any hour by calling [phone]  [phone].   DIET:  We do recommend a small meal at first, but then you may proceed to your regular diet.  Drink plenty of fluids but you should avoid alcoholic beverages for 24 hours.  ACTIVITY:  You should plan to take it easy for the rest of today and you should NOT DRIVE or use heavy machinery until tomorrow (because of the sedation medicines used during the test).    FOLLOW UP: Our staff will call the number listed on your records 48-72 hours following your procedure to check on you and address any questions or concerns that you may have regarding the information given to you following your procedure. If we do not reach you, we will leave a message.  We will attempt to reach you two times.  During this call, we will ask if you have developed any symptoms of COVID 19. If you develop any symptoms (ie: fever, flu-like symptoms, shortness of breath, cough etc.) before then, please call [phone].  If you test positive for Covid 19 in the 2 weeks post procedure, please call and report this information to [place].    If any biopsies were taken you will be contacted by phone or by letter within the next 1-3 weeks.  Please call us at [phone] if you have not heard about the biopsies in 3 weeks.    SIGNATURES/CONFIDENTIALITY: You and/or your care partner have signed paperwork which will be entered into your electronic medical record.  These signatures attest to the fact that that the information above on your After Visit Summary has  been reviewed and is understood.  Full responsibility of the confidentiality of this discharge information lies with you and/or your care-partner.

## 2019-07-29 NOTE — Consult Note (Signed)
Step Temp. David Pennington Vital signs. Robbin check in and IV. Pt's states no medical or surgical changes since previsit or office visit.

## 2019-07-31 ENCOUNTER — Telehealth: Payer: Self-pay

## 2019-07-31 NOTE — Telephone Encounter (Signed)
  Follow up Call-  Call back number 07/29/2019 06/02/2019  Post procedure Call Back phone  # [phone]  Permission to leave phone message Yes Yes  Some recent data might be hidden     Patient questions:  Do you have a fever, pain , or abdominal swelling? No. Pain Score  0 *  Have you tolerated food without any problems? Yes.    Have you been able to return to your normal activities? Yes.    Do you have any questions about your discharge instructions: Diet   No. Medications  No. Follow up visit  No.  Do you have questions or concerns about your Care? No.  Actions: * If pain score is 4 or above: No action needed, pain <4.

## 2019-08-01 ENCOUNTER — Encounter: Payer: Self-pay | Admitting: Gastroenterology

## 2019-12-08 IMAGING — DX ABDOMEN - 1 VIEW
1 series · 1 of 1 positions shown · non-contrast
Comparison: None.

CLINICAL DATA: Per pt: mid stomach pain x 2 weeks. Not getting any
better. No previous injury or surgery. ab pain X1 month, LLQ, weight
loss

EXAM:
ABDOMEN - 1 VIEW

[abdomen kub]
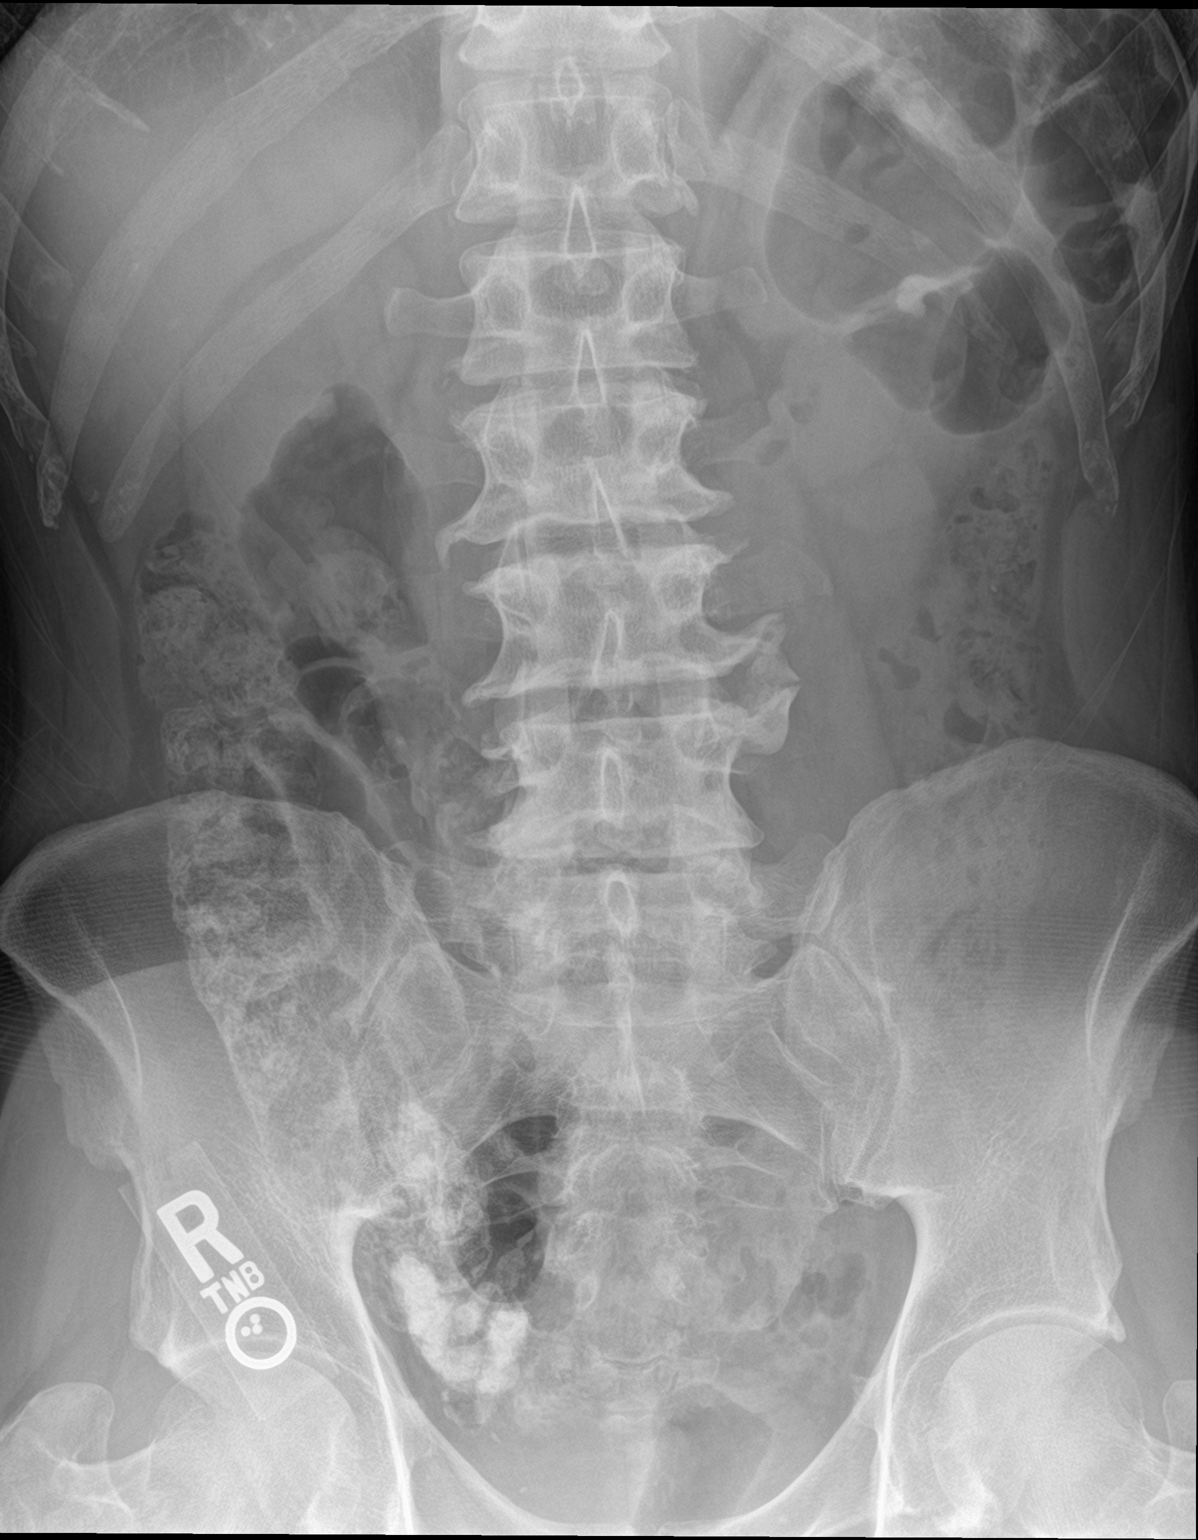

[1 of 1 positions shown; findings below may reference images not displayed]

FINDINGS: No dilated loops of large or small bowel. High-density material in
the RIGHT lower quadrant is favored stool contents. No pathologic
calcifications. No organomegaly. No aggressive osseous lesion. No
intraperitoneal free air.
IMPRESSION: No acute abdominal findings.  No bowel obstruction.

## 2020-08-29 ENCOUNTER — Other Ambulatory Visit: Payer: Self-pay

## 2020-08-29 ENCOUNTER — Encounter (HOSPITAL_COMMUNITY): Payer: Self-pay | Admitting: Emergency Medicine

## 2020-08-29 ENCOUNTER — Emergency Department (HOSPITAL_COMMUNITY)
Admission: EM | Admit: 2020-08-29 | Discharge: 2020-08-29 | Disposition: A | Discharge status: Home / Self Care | Payer: PRIVATE HEALTH INSURANCE | Attending: Emergency Medicine | Admitting: Emergency Medicine

## 2020-08-29 DIAGNOSIS — I10 Essential (primary) hypertension: Secondary | ICD-10-CM | POA: Insufficient documentation

## 2020-08-29 DIAGNOSIS — R1013 Epigastric pain: Secondary | ICD-10-CM

## 2020-08-29 DIAGNOSIS — F17210 Nicotine dependence, cigarettes, uncomplicated: Secondary | ICD-10-CM | POA: Insufficient documentation

## 2020-08-29 LAB — COMPREHENSIVE METABOLIC PANEL
ALT: 14 U/L (ref 0–44)
AST: 22 U/L (ref 15–41)
Albumin: 4.4 g/dL (ref 3.5–5.0)
Alkaline Phosphatase: 48 U/L (ref 38–126)
Anion gap: 14 (ref 5–15)
BUN: 9 mg/dL (ref 6–20)
CO2: 25 mmol/L (ref 22–32)
Calcium: 9.7 mg/dL (ref 8.9–10.3)
Chloride: 99 mmol/L (ref 98–111)
Creatinine, Ser: 1.09 mg/dL (ref 0.61–1.24)
GFR calc Af Amer: >60 mL/min (ref >60)
GFR calc non Af Amer: >60 mL/min (ref >60)
Glucose, Bld: 177 mg/dL — ABNORMAL HIGH (ref 70–99)
Potassium: 3.2 mmol/L — ABNORMAL LOW (ref 3.5–5.1)
Sodium: 138 mmol/L (ref 135–145)
Total Bilirubin: 0.7 mg/dL (ref 0.3–1.2)
Total Protein: 8.1 g/dL (ref 6.5–8.1)

## 2020-08-29 LAB — CBC
HCT: 44.1 % (ref 39.0–52.0)
Hemoglobin: 14.8 g/dL (ref 13.0–17.0)
MCH: 33.3 pg (ref 26.0–34.0)
MCHC: 33.6 g/dL (ref 30.0–36.0)
MCV: 99.1 fL (ref 80.0–100.0)
Platelets: 254 10*3/uL (ref 150–400)
RBC: 4.45 MIL/uL (ref 4.22–5.81)
RDW: 12.7 % (ref 11.5–15.5)
WBC: 5.2 10*3/uL (ref 4.0–10.5)
nRBC: 0 % (ref 0.0–0.2)

## 2020-08-29 LAB — LIPASE, BLOOD: Lipase: 28 U/L (ref 11–51)

## 2020-08-29 MED ORDER — SUCRALFATE 1 G PO TABS: 1.0000 g | ORAL_TABLET | Freq: Four times a day (QID) | ORAL | 0 refills | Status: DC | PRN

## 2020-08-29 MED ORDER — OMEPRAZOLE 20 MG PO CPDR: 20.0000 mg | DELAYED_RELEASE_CAPSULE | Freq: Every day | ORAL | 1 refills | Status: DC

## 2020-08-29 NOTE — ED Provider Notes (Signed)
[facility] Emergency Department Provider Note MRN:  [id number]  Arrival date & time: 08/29/20     Chief Complaint   Abdominal Pain   History of Present Illness   David Pennington is a 54 y.o. year-old male with a history of hypertension presenting to the ED with chief complaint of abdominal pain.  Location: [place] Duration: Several months but worse over the past week Onset: Gradual Timing: Intermittent Description: Dull burning Severity: Mild to moderate Exacerbating/Alleviating Factors: Worse with meals, worse when lying flat Associated Symptoms: None Pertinent Negatives: Denies fever, no vomiting, no diarrhea, no chest pain, no shortness of breath, no lower abdominal pain, no dysuria, no blood in the stool, no black stool   Review of Systems  A complete 10 system review of systems was obtained and all systems are negative except as noted in the HPI and PMH.   Patient's Health History    Past Medical History:  Diagnosis Date  . Hypertension     History reviewed. No pertinent surgical history.  Family History  Family history unknown: Yes    Social History   Socioeconomic History  . Marital status: Single    Spouse name: Not on file  . Number of children: Not on file  . Years of education: Not on file  . Highest education level: Not on file  Occupational History  . Not on file  Tobacco Use  . Smoking status: Current Every Day Smoker    Packs/day: 1.00    Types: Cigarettes  . Smokeless tobacco: Never Used  Substance and Sexual Activity  . Alcohol use: Yes    Comment: occasionally  . Drug use: No  . Sexual activity: Not on file  Other Topics Concern  . Not on file  Social History Narrative  . Not on file   Social Determinants of Health   Financial Resource Strain:   . Difficulty of Paying Living Expenses: Not on file  Food Insecurity:   . Worried About Charity fundraiser in the Last Year: Not on file  . Ran Out of Food in  the Last Year: Not on file  Transportation Needs:   . Lack of Transportation (Medical): Not on file  . Lack of Transportation (Non-Medical): Not on file  Physical Activity:   . Days of Exercise per Week: Not on file  . Minutes of Exercise per Session: Not on file  Stress:   . Feeling of Stress : Not on file  Social Connections:   . Frequency of Communication with Friends and Family: Not on file  . Frequency of Social Gatherings with Friends and Family: Not on file  . Attends Religious Services: Not on file  . Active Member of Clubs or Organizations: Not on file  . Attends Archivist Meetings: Not on file  . Marital Status: Not on file  Intimate Partner Violence:   . Fear of Current or Ex-Partner: Not on file  . Emotionally Abused: Not on file  . Physically Abused: Not on file  . Sexually Abused: Not on file     Physical Exam   Vitals:   08/29/20 0944 08/29/20 1258  BP: (!) 178/92 (!) 174/103  Pulse: 81 77  Resp: 16 15  Temp: 98.1 F (36.7 C)   SpO2: 100% 100%    CONSTITUTIONAL: Well-appearing, NAD NEURO:  Alert and oriented x 3, no focal deficits EYES:  eyes equal and reactive ENT/NECK:  no LAD, no JVD CARDIO: Regular rate, well-perfused, normal S1  and S2 PULM:  CTAB no wheezing or rhonchi GI/GU:  normal bowel sounds, non-distended, non-tender MSK/SPINE:  No gross deformities, no edema SKIN:  no rash, atraumatic PSYCH:  Appropriate speech and behavior  *Additional and/or pertinent findings included in MDM below  Diagnostic and Interventional Summary    EKG Interpretation  Date/Time:    Ventricular Rate:    PR Interval:    QRS Duration:   QT Interval:    QTC Calculation:   R Axis:     Text Interpretation:        Labs Reviewed  COMPREHENSIVE METABOLIC PANEL - Abnormal; Notable for the following components:      Result Value   Potassium 3.2 (*)    Glucose, Bld 177 (*)    All other components within normal limits  LIPASE, BLOOD  CBC    URINALYSIS, ROUTINE W REFLEX MICROSCOPIC    No orders to display    Medications - No data to display   Procedures  /  Critical Care Procedures  ED Course and Medical Decision Making  I have reviewed the triage vital signs, the nursing notes, and pertinent available records from the EMR.  Listed above are laboratory and imaging tests that I personally ordered, reviewed, and interpreted and then considered in my medical decision making (see below for details).  History is consistent with acid reflux related pain.  Exam is very reassuring, normal vital signs, no significant tenderness or rebound or guarding or rigidity.  Labs reassuring, no indication for further testing or imaging.  Appropriate for GERD management and PCP follow-up.       Barth Kirks. [place]onia Small, [place] [email or web address]  Final Clinical Impressions(s) / ED Diagnoses     ICD-10-CM   1. Epigastric pain  R10.13     ED Discharge Orders         Ordered    omeprazole (PRILOSEC) 20 MG capsule  Daily        08/29/20 1426    sucralfate (CARAFATE) 1 g tablet  4 times daily PRN        08/29/20 1426           Discharge Instructions Discussed with and Provided to Patient:     Discharge Instructions     You were evaluated in the Emergency Department and after careful evaluation, we did not find any emergent condition requiring admission or further testing in the hospital.  Your exam/testing today was overall reassuring.  Your symptoms seem to be due to acid reflux.  Please take the omeprazole medication once daily to prevent pain.  You can also use the Carafate medication up to 4 times daily.  We recommend follow-up with a primary care doctor if these medications do not help.  Please return to the Emergency Department if you experience any worsening of your condition.  Thank you for allowing [place] to be a part of your care.       Maudie Flakes, MD 08/29/20  1430

## 2020-08-29 NOTE — ED Triage Notes (Signed)
C/o intermittent abd pain x 1 week.  Denies nausea, vomiting, and diarrhea.  No pain at present.

## 2020-08-29 NOTE — Discharge Instructions (Addendum)
You were evaluated in the Emergency Department and after careful evaluation, we did not find any emergent condition requiring admission or further testing in the hospital.  Your exam/testing today was overall reassuring.  Your symptoms seem to be due to acid reflux.  Please take the omeprazole medication once daily to prevent pain.  You can also use the Carafate medication up to 4 times daily.  We recommend follow-up with a primary care doctor if these medications do not help.  Please return to the Emergency Department if you experience any worsening of your condition.  Thank you for allowing [place] to be a part of your care.

## 2020-10-19 ENCOUNTER — Ambulatory Visit: Payer: Self-pay | Attending: Nurse Practitioner | Admitting: Nurse Practitioner

## 2020-10-19 ENCOUNTER — Encounter: Payer: Self-pay | Admitting: Nurse Practitioner

## 2020-10-19 ENCOUNTER — Other Ambulatory Visit: Payer: Self-pay

## 2020-10-19 VITALS — Ht 77.0 in | Wt 178.0 lb

## 2020-10-19 DIAGNOSIS — I10 Essential (primary) hypertension: Secondary | ICD-10-CM

## 2020-10-19 DIAGNOSIS — K219 Gastro-esophageal reflux disease without esophagitis: Secondary | ICD-10-CM

## 2020-10-19 DIAGNOSIS — Z7689 Persons encountering health services in other specified circumstances: Secondary | ICD-10-CM

## 2020-10-19 MED ORDER — AMLODIPINE BESYLATE 10 MG PO TABS: 10.0000 mg | ORAL_TABLET | Freq: Every day | ORAL | 3 refills | Status: DC

## 2020-10-19 MED ORDER — OMEPRAZOLE 20 MG PO CPDR: 20.0000 mg | DELAYED_RELEASE_CAPSULE | Freq: Every day | ORAL | 1 refills | Status: DC

## 2020-10-19 MED FILL — AMLODIPINE BESYLATE 10 MG T: 10 | 30 days supply | Qty: 30 | Fill #0

## 2020-10-19 MED FILL — OMEPRAZOLE 20 MG CAP: 20 | 30 days supply | Qty: 30 | Fill #0

## 2020-10-19 NOTE — Progress Notes (Signed)
Virtual Visit via Telephone Note Due to national recommendations of social distancing due to [place] 19, telehealth visit is felt to be most appropriate for this patient at this time.  I discussed the limitations, risks, security and privacy concerns of performing an evaluation and management service by telephone and the availability of in person appointments. I also discussed with the patient that there may be a patient responsible charge related to this service. The patient expressed understanding and agreed to proceed.    I connected with David Pennington on 10/19/20  at   8:50 AM EST  EDT by telephone and verified that I am speaking with the correct person using two identifiers.   Consent I discussed the limitations, risks, security and privacy concerns of performing an evaluation and management service by telephone and the availability of in person appointments. I also discussed with the patient that there may be a patient responsible charge related to this service. The patient expressed understanding and agreed to proceed.   Location of Patient: Private Residence   Location of Provider: [place] and [employer] Office    Persons participating in Telemedicine visit: David Rankins FNP-BC [place]    History of Present Illness: Telemedicine visit for: Establish Care PMH: HTN   Essential Hypertension Monitoring his blood pressure at home with readings 190-200/80-90s, He has taken HCTZ 12.5 mg in the past however he has not been on this medication in over a year. Will start him on amlodipine 10 mg daily.  Reluctant to start thiazide diuretics as he has a history of hypokalemia.Denies chest pain, shortness of breath, palpitations, lightheadedness, dizziness, headaches or BLE edema.  BP Readings from Last 3 Encounters:  08/29/20 (!) 220/98  07/29/19 (!) 137/92  06/02/19 (!) 155/83   GERD Well controlled with omeprazole 20 mg daily. Denies hemoptysis,  nausea, vomiting, epigastric pain.    Past Medical History:  Diagnosis Date   Hypertension     Past Surgical History:  Procedure Laterality Date   NO PAST SURGERIES      Family History  Problem Relation Age of Onset   Hypertension Sister    Hypertension Brother    Hypertension Sister     Social History   Socioeconomic History   Marital status: Single    Spouse name: Not on file   Number of children: Not on file   Years of education: Not on file   Highest education level: Not on file  Occupational History   Not on file  Tobacco Use   Smoking status: Current Every Day Smoker    Packs/day: 1.00    Types: Cigarettes   Smokeless tobacco: Never Used  Substance and Sexual Activity   Alcohol use: Yes    Comment: occasionally   Drug use: No   Sexual activity: Yes  Other Topics Concern   Not on file  Social History Narrative   Not on file   Social Determinants of Health   Financial Resource Strain:    Difficulty of Paying Living Expenses: Not on file  Food Insecurity:    Worried About [place] in the Last Year: Not on file   Ran Out of Food in the Last Year: Not on file  Transportation Needs:    Lack of Transportation (Medical): Not on file   Lack of Transportation (Non-Medical): Not on file  Physical Activity:    Days of Exercise per Week: Not on file   Minutes of Exercise per Session: Not  on file  Stress:    Feeling of Stress : Not on file  Social Connections:    Frequency of Communication with Friends and Family: Not on file   Frequency of Social Gatherings with Friends and Family: Not on file   Attends Religious Services: Not on file   Active Member of Clubs or Organizations: Not on file   Attends Archivist Meetings: Not on file   Marital Status: Not on file     Observations/Objective: Awake, alert and oriented x 3   Review of Systems  Constitutional: Negative for fever, malaise/fatigue and weight  loss.  HENT: Negative.  Negative for nosebleeds.   Eyes: Negative.  Negative for blurred vision, double vision and photophobia.  Respiratory: Negative.  Negative for cough and shortness of breath.   Cardiovascular: Negative.  Negative for chest pain, palpitations and leg swelling.  Gastrointestinal: Negative.  Negative for heartburn, nausea and vomiting.  Musculoskeletal: Negative.  Negative for myalgias.  Neurological: Negative.  Negative for dizziness, focal weakness, seizures and headaches.  Psychiatric/Behavioral: Negative.  Negative for suicidal ideas.    Assessment and Plan: Ramar was seen today for new patient (initial visit).  Diagnoses and all orders for this visit:  Encounter to establish care  Primary hypertension -     amLODipine (NORVASC) 10 MG tablet; Take 1 tablet (10 mg total) by mouth daily.  GERD without esophagitis -     omeprazole (PRILOSEC) 20 MG capsule; Take 1 capsule (20 mg total) by mouth daily.     Follow Up Instructions No follow-ups on file.     I discussed the assessment and treatment plan with the patient. The patient was provided an opportunity to ask questions and all were answered. The patient agreed with the plan and demonstrated an understanding of the instructions.   The patient was advised to call back or seek an in-person evaluation if the symptoms worsen or if the condition fails to improve as anticipated.  I provided 18 minutes of non-face-to-face time during this encounter including median intraservice time, reviewing previous notes, labs, imaging, medications and explaining diagnosis and management.  Gildardo Pounds, FNP-BC

## 2020-10-24 ENCOUNTER — Encounter: Payer: Self-pay | Admitting: Nurse Practitioner

## 2020-10-25 ENCOUNTER — Other Ambulatory Visit: Payer: Self-pay

## 2020-11-01 ENCOUNTER — Ambulatory Visit: Payer: Self-pay | Admitting: Pharmacist

## 2020-11-11 ENCOUNTER — Ambulatory Visit: Payer: Self-pay | Admitting: Pharmacist

## 2020-11-23 ENCOUNTER — Ambulatory Visit: Payer: Self-pay | Admitting: Nurse Practitioner

## 2021-01-07 ENCOUNTER — Ambulatory Visit: Payer: Self-pay | Admitting: Nurse Practitioner

## 2021-06-03 ENCOUNTER — Ambulatory Visit (HOSPITAL_COMMUNITY)
Admission: EM | Admit: 2021-06-03 | Discharge: 2021-06-03 | Disposition: A | Discharge status: Home / Self Care | Payer: Self-pay | Attending: Urgent Care | Admitting: Urgent Care

## 2021-06-03 ENCOUNTER — Other Ambulatory Visit: Payer: Self-pay

## 2021-06-03 ENCOUNTER — Encounter (HOSPITAL_COMMUNITY): Payer: Self-pay | Admitting: Emergency Medicine

## 2021-06-03 ENCOUNTER — Emergency Department (HOSPITAL_COMMUNITY): Payer: Self-pay

## 2021-06-03 ENCOUNTER — Emergency Department (HOSPITAL_COMMUNITY)
Admission: EM | Admit: 2021-06-03 | Discharge: 2021-06-03 | Disposition: A | Discharge status: Home / Self Care | Payer: Self-pay | Attending: Emergency Medicine | Admitting: Emergency Medicine

## 2021-06-03 DIAGNOSIS — I10 Essential (primary) hypertension: Secondary | ICD-10-CM

## 2021-06-03 DIAGNOSIS — F17210 Nicotine dependence, cigarettes, uncomplicated: Secondary | ICD-10-CM | POA: Insufficient documentation

## 2021-06-03 DIAGNOSIS — I160 Hypertensive urgency: Secondary | ICD-10-CM

## 2021-06-03 DIAGNOSIS — Z5189 Encounter for other specified aftercare: Secondary | ICD-10-CM

## 2021-06-03 LAB — BASIC METABOLIC PANEL
Anion gap: 7 (ref 5–15)
BUN: 10 mg/dL (ref 6–20)
CO2: 30 mmol/L (ref 22–32)
Calcium: 9.5 mg/dL (ref 8.9–10.3)
Chloride: 102 mmol/L (ref 98–111)
Creatinine, Ser: 0.96 mg/dL (ref 0.61–1.24)
GFR, Estimated: >60 mL/min (ref >60)
Glucose, Bld: 100 mg/dL — ABNORMAL HIGH (ref 70–99)
Potassium: 3.6 mmol/L (ref 3.5–5.1)
Sodium: 139 mmol/L (ref 135–145)

## 2021-06-03 LAB — CBC WITH DIFFERENTIAL/PLATELET
Abs Immature Granulocytes: 0.01 10*3/uL (ref 0.00–0.07)
Basophils Absolute: 0.1 10*3/uL (ref 0.0–0.1)
Basophils Relative: 1 %
Eosinophils Absolute: 0.1 10*3/uL (ref 0.0–0.5)
Eosinophils Relative: 2 %
HCT: 43.2 % (ref 39.0–52.0)
Hemoglobin: 14.5 g/dL (ref 13.0–17.0)
Immature Granulocytes: 0 %
Lymphocytes Relative: 31 %
Lymphs Abs: 1.8 10*3/uL (ref 0.7–4.0)
MCH: 33.6 pg (ref 26.0–34.0)
MCHC: 33.6 g/dL (ref 30.0–36.0)
MCV: 100.2 fL — ABNORMAL HIGH (ref 80.0–100.0)
Monocytes Absolute: 0.4 10*3/uL (ref 0.1–1.0)
Monocytes Relative: 7 %
Neutro Abs: 3.5 10*3/uL (ref 1.7–7.7)
Neutrophils Relative %: 59 %
Platelets: 271 10*3/uL (ref 150–400)
RBC: 4.31 MIL/uL (ref 4.22–5.81)
RDW: 13.7 % (ref 11.5–15.5)
WBC: 5.9 10*3/uL (ref 4.0–10.5)
nRBC: 0 % (ref 0.0–0.2)

## 2021-06-03 MED ORDER — HYDROCHLOROTHIAZIDE 12.5 MG PO CAPS
12.5000 mg | ORAL_CAPSULE | Freq: Every day | ORAL | Status: DC
  Administered 2021-06-03: 12.5 mg via ORAL
  Filled 2021-06-03: qty 1

## 2021-06-03 MED ORDER — ACETAMINOPHEN 325 MG PO TABS
ORAL_TABLET | ORAL | Status: AC
  Filled 2021-06-03: qty 2

## 2021-06-03 MED ORDER — HYDROCHLOROTHIAZIDE 25 MG PO TABS: 25.0000 mg | ORAL_TABLET | Freq: Every day | ORAL | 2 refills | Status: DC

## 2021-06-03 MED ORDER — ACETAMINOPHEN 325 MG PO TABS
650.0000 mg | ORAL_TABLET | Freq: Once | ORAL | Status: AC
  Administered 2021-06-03: 650 mg via ORAL

## 2021-06-03 MED ORDER — AMLODIPINE BESYLATE 10 MG PO TABS: 10.0000 mg | ORAL_TABLET | Freq: Every day | ORAL | 3 refills | Status: DC

## 2021-06-03 MED ORDER — AMLODIPINE BESYLATE 5 MG PO TABS
10.0000 mg | ORAL_TABLET | Freq: Once | ORAL | Status: AC
  Administered 2021-06-03: 10 mg via ORAL
  Filled 2021-06-03: qty 2

## 2021-06-03 NOTE — ED Triage Notes (Signed)
Pt is present today with a skin infection on his right forearm from a TB skin test. Pt states that he had the test done this Tuesday.

## 2021-06-03 NOTE — ED Notes (Signed)
Patient transported to CT 

## 2021-06-03 NOTE — ED Notes (Signed)
Patient is being discharged from the Urgent Care and sent to the Emergency Department via POV . Per Jaynee Eagles, PA-C, patient is in need of higher level of care due to hypertension. Patient is aware and verbalizes understanding of plan of care.  Vitals:   06/03/21 1558 06/03/21 1559  BP: (!) 207/97 (!) 210/100  Pulse: 61   Resp: 19   SpO2: 97%

## 2021-06-03 NOTE — ED Notes (Signed)
Pt ambulated to restroom without need of assistance.

## 2021-06-03 NOTE — ED Triage Notes (Signed)
Pt here from UC for eval of new-onset HTN. States HA, CP, dizziness since yesterday. BP 684 systolic R arm, 033 systolic L arm. HA hurts worst, 6/10, throbbing.

## 2021-06-03 NOTE — ED Notes (Signed)
Pt returned from MRI °

## 2021-06-03 NOTE — ED Provider Notes (Signed)
[place]   MRN: [id number] DOB: 1966/08/22  Subjective:   David Pennington is a 55 y.o. male presenting for wound check.  Patient had PPD placed 4 days ago.  Reports that the area became swollen and popped.  He wants to make sure that it is not an allergic reaction.  Denies erythema, drainage of pus or bleeding, swelling.  There is no tenderness.  On triage he was noticed to have blood pressure of 210/100.  Has a longstanding history of HTN, has not taken his amlodipine in more than 1 year. Has had persistent dizziness in this past week. Has also had intermittent mid-left sided chest pain, confusion, last episode was yesterday.  Felt like he did not know where he was or who he wants before his memory came back to him.  The chest pain resolved with use of Tylenol.  He also reports that he took this for headache that he has had for the past 2 days.  It is on either temporal side, rated a 5 out of 10 currently with associated dizziness.  Denies active confusion, weakness, numbness or tingling, chest pain, abdominal pain, hematuria, double vision, blurred vision.  Patient is a smoker, also drinks alcohol.  Uses marijuana on occasion.  No other drug use.  Denies family history of stroke, heart attacks.   Current Facility-Administered Medications:    0.9 %  sodium chloride infusion, 500 mL, Intravenous, Once, [place], MD  Current Outpatient Medications:    amLODipine (NORVASC) 10 MG tablet, Take 1 tablet (10 mg total) by mouth daily., Disp: 90 tablet, Rfl: 3   omeprazole (PRILOSEC) 20 MG capsule, Take 1 capsule (20 mg total) by mouth daily., Disp: 90 capsule, Rfl: 1   No Known Allergies  Past Medical History:  Diagnosis Date   Hypertension      Past Surgical History:  Procedure Laterality Date   NO PAST SURGERIES      Family History  Problem Relation Age of Onset   Hypertension Sister    Hypertension Brother    Hypertension Sister     Social History    Tobacco Use   Smoking status: Every Day    Packs/day: 1.00    Pack years: 0.00    Types: Cigarettes   Smokeless tobacco: Never  Substance Use Topics   Alcohol use: Yes    Comment: occasionally   Drug use: No    ROS   Objective:   Vitals: BP (!) 210/100   Pulse 61   Resp 19   SpO2 97%   BP Readings from Last 3 Encounters:  06/03/21 (!) 210/100  08/29/20 (!) 220/98  07/29/19 (!) 137/92   BP 213/102 on recheck by PA-Ariyan Sinnett.   Physical Exam Constitutional:      General: He is not in acute distress.    Appearance: Normal appearance. He is well-developed. He is not ill-appearing, toxic-appearing or diaphoretic.  HENT:     Head: Normocephalic and atraumatic.     Right Ear: External ear normal.     Left Ear: External ear normal.     Nose: Nose normal.     Mouth/Throat:     Mouth: Mucous membranes are moist.     Pharynx: Oropharynx is clear.  Eyes:     General: No scleral icterus.    Extraocular Movements: Extraocular movements intact.     Pupils: Pupils are equal, round, and reactive to light.  Cardiovascular:     Rate and Rhythm: Normal rate  and regular rhythm.     Heart sounds: Normal heart sounds. No murmur heard.   No friction rub. No gallop.  Pulmonary:     Effort: Pulmonary effort is normal. No respiratory distress.     Breath sounds: Normal breath sounds. No stridor. No wheezing, rhonchi or rales.  Abdominal:     General: Bowel sounds are normal. There is no distension.     Palpations: Abdomen is soft. There is no mass.     Tenderness: There is no abdominal tenderness. There is no guarding or rebound.  Musculoskeletal:       Arms:  Skin:    General: Skin is warm and dry.  Neurological:     Mental Status: He is alert and oriented to person, place, and time.     Cranial Nerves: No cranial nerve deficit.     Motor: No weakness.     Coordination: Coordination normal.     Gait: Gait normal.     Deep Tendon Reflexes: Reflexes normal.     Comments:  Negative Romberg and pronator drift.  Psychiatric:        Mood and Affect: Mood normal.        Behavior: Behavior normal.        Thought Content: Thought content normal.        Judgment: Judgment normal.   Applied a dressing to the right forearm wound.  ED ECG REPORT   Date: 06/03/2021  Rate: 58bpm  Rhythm: normal sinus rhythm  QRS Axis: normal  Intervals: normal  ST/T Wave abnormalities: nonspecific T wave changes  Conduction Disutrbances:none  Narrative Interpretation: Non-specific T-wave flattening in Lead aVL, LVH criteria, otherwise sinus rhythm at 58 bpm. No previous ecg available for comparison.   Old EKG Reviewed: none available  I have personally reviewed the EKG tracing and agree with the computerized printout as noted.  Assessment and Plan :   PDMP not reviewed this encounter.  1. Hypertensive urgency   2. Visit for wound check     Patient has hypertensive urgency and has multiple concerning symptoms including a moderate headache, dizziness, an episode of chest pain and confusion yesterday.  Discussed possibility of stroke, ACS, kidney injuries from his uncontrolled hypertension.  Offered patient transport to the hospital for an ER evaluation and intervention but he refused.  Subsequently, we discussed this with his niece who verbalized understanding the risks of a possible stroke, ACS in light of his severely uncontrolled high blood pressure.  She contracts for safety and will drive him to the emergency room now.  He was given Tylenol prior to leaving the clinic.  Discussed wound care and recommended conservative management.  I placed patient seen into the PCP assistance program for long-term management and follow-up of his hypertension.   Jaynee Eagles, [place] 06/03/21 1941

## 2021-06-03 NOTE — ED Provider Notes (Signed)
[facility] EMERGENCY DEPARTMENT Provider Note   CSN: [id number] Arrival date & time: 06/03/21  1634     History Chief Complaint  Patient presents with   Dizziness   Hypertension        Headache   Chest Pain    David Pennington is a 55 y.o. male.   Dizziness Associated symptoms: chest pain (episodic, non currently and none today) and headaches   Associated symptoms: no palpitations, no shortness of breath and no vomiting   Hypertension Associated symptoms include chest pain (episodic, non currently and none today) and headaches. Pertinent negatives include no abdominal pain and no shortness of breath.  Headache Associated symptoms: dizziness   Associated symptoms: no abdominal pain, no back pain, no cough, no ear pain, no eye pain, no fever, no seizures, no sore throat and no vomiting   Chest Pain Associated symptoms: dizziness and headache   Associated symptoms: no abdominal pain, no back pain, no cough, no fever, no palpitations, no shortness of breath and no vomiting    Patient is a 55 year old male with a history of hypertension previously prescribed amlodipine however has not been taking for over 1 year who presents to the emergency department sent from urgent care for high blood pressure after presenting there to check on a TB test that he had placed in his right lower arm for a new job.  He reported intermittent episodes of dizziness and headache.  And time of my exam patient was asymptomatic.  He did endorse episodes of headache and dizziness over the last month most recent which was yesterday that resolved after p.o. Tylenol. No recent head injury.  Denies weakness, difficulty walking, numbness, tingling, difficulty with vision.     Past Medical History:  Diagnosis Date   Hypertension     There are no problems to display for this patient.   Past Surgical History:  Procedure Laterality Date   NO PAST SURGERIES         Family History   Problem Relation Age of Onset   Hypertension Sister    Hypertension Brother    Hypertension Sister     Social History   Tobacco Use   Smoking status: Every Day    Packs/day: 1.00    Pack years: 0.00    Types: Cigarettes   Smokeless tobacco: Never  Substance Use Topics   Alcohol use: Yes    Comment: occasionally   Drug use: No    Home Medications Prior to Admission medications   Medication Sig Start Date End Date Taking? Authorizing Provider  hydrochlorothiazide (HYDRODIURIL) 25 MG tablet Take 1 tablet (25 mg total) by mouth daily. 06/03/21 09/01/21 Yes Pearson Grippe, DO  amLODipine (NORVASC) 10 MG tablet Take 1 tablet (10 mg total) by mouth daily. 06/03/21   Pearson Grippe, DO  omeprazole (PRILOSEC) 20 MG capsule Take 1 capsule (20 mg total) by mouth daily. Patient not taking: Reported on 06/03/2021 10/19/20 06/03/21  Gildardo Pounds, NP    Allergies    Patient has no known allergies.  Review of Systems   Review of Systems  Constitutional:  Negative for chills and fever.  HENT:  Negative for ear pain and sore throat.   Eyes:  Negative for pain and visual disturbance.  Respiratory:  Negative for cough and shortness of breath.   Cardiovascular:  Positive for chest pain (episodic, non currently and none today). Negative for palpitations.  Gastrointestinal:  Negative for abdominal pain and vomiting.  Genitourinary:  Negative for dysuria and hematuria.  Musculoskeletal:  Negative for arthralgias and back pain.  Skin:  Negative for color change and rash.  Neurological:  Positive for dizziness and headaches. Negative for seizures and syncope.  All other systems reviewed and are negative.  Physical Exam Updated Vital Signs BP (!) 182/94   Pulse (!) 54   Temp 98.6 F (37 C) (Oral)   Resp 17   Ht 6\' 5"  (1.956 m)   Wt 79.4 kg   SpO2 98%   BMI 20.75 kg/m   Physical Exam Vitals and nursing note reviewed.  Constitutional:      Appearance: He is well-developed. He  is not ill-appearing or toxic-appearing.     Comments:  Sitting comfortably watching television.  HENT:     Head: Normocephalic and atraumatic.  Eyes:     General: Vision grossly intact. No visual field deficit.    Extraocular Movements: Extraocular movements intact.     Conjunctiva/sclera: Conjunctivae normal.     Pupils: Pupils are equal, round, and reactive to light.  Cardiovascular:     Rate and Rhythm: Normal rate and regular rhythm.     Pulses: Normal pulses.          Radial pulses are 2+ on the right side and 2+ on the left side.       Dorsalis pedis pulses are 2+ on the right side and 2+ on the left side.     Heart sounds: No murmur heard. Pulmonary:     Effort: Pulmonary effort is normal. No tachypnea or respiratory distress.     Breath sounds: Normal breath sounds. No decreased air movement.  Abdominal:     General: Abdomen is flat.     Palpations: Abdomen is soft.     Tenderness: There is no abdominal tenderness.  Musculoskeletal:     Cervical back: Full passive range of motion without pain, normal range of motion and neck supple.     Right lower leg: No edema.     Left lower leg: No edema.  Skin:    General: Skin is warm and dry.  Neurological:     General: No focal deficit present.     Mental Status: He is alert and oriented to person, place, and time.     GCS: GCS eye subscore is 4. GCS verbal subscore is 5. GCS motor subscore is 6.     Cranial Nerves: Cranial nerves are intact. No cranial nerve deficit, dysarthria or facial asymmetry.     Sensory: Sensation is intact. No sensory deficit.     Motor: Motor function is intact. No weakness, tremor, atrophy, abnormal muscle tone, seizure activity or pronator drift.     Coordination: Coordination is intact. Romberg sign negative. Coordination normal. Finger-Nose-Finger Test and Heel to [facility] Test normal.    ED Results / Procedures / Treatments   Labs (all labs ordered are listed, but only abnormal results are  displayed) Labs Reviewed  CBC WITH DIFFERENTIAL/PLATELET - Abnormal; Notable for the following components:      Result Value   MCV 100.2 (*)    All other components within normal limits  BASIC METABOLIC PANEL - Abnormal; Notable for the following components:   Glucose, Bld 100 (*)    All other components within normal limits    EKG None  Radiology CT Head Wo Contrast  Result Date: 06/03/2021 CLINICAL DATA:  Dizziness, nonspecific EXAM: CT HEAD WITHOUT CONTRAST TECHNIQUE: Contiguous axial images were obtained from the base of the skull  through the vertex without intravenous contrast. COMPARISON:  None. FINDINGS: Brain: No evidence of acute infarction, hemorrhage, hydrocephalus, extra-axial collection, visible mass lesion or mass effect. Vascular: Atherosclerotic calcification of the carotid siphons. No hyperdense vessel. Skull: No calvarial fracture or suspicious osseous lesion. No scalp swelling or hematoma. Sinuses/Orbits: Paranasal sinuses and mastoid air cells are predominantly clear. Included orbital structures are unremarkable. Other: None. IMPRESSION: No acute intracranial abnormality. Electronically Signed   By: Lovena Le M.D.   On: 06/03/2021 18:54    Procedures Procedures   Medications Ordered in ED Medications  hydrochlorothiazide (MICROZIDE) capsule 12.5 mg (12.5 mg Oral Given 06/03/21 1740)  amLODipine (NORVASC) tablet 10 mg (10 mg Oral Given 06/03/21 1740)    ED Course  I have reviewed the triage vital signs and the nursing notes.  Pertinent labs & imaging results that were available during my care of the patient were reviewed by me and considered in my medical decision making (see chart for details).  Clinical Course as of 06/03/21 2115  Fri Jun 03, 2021  6834 Basic metabolic panel(!) No evidence of renal disease. Electrolytes all wnl.  [ZB]    Clinical Course User Index [ZB] Pearson Grippe, DO   MDM Rules/Calculators/A&P                          My  impression is largely asymptomatic hypertension.  He has had intermittent nonspecific symptoms of headache, dizziness that have been going on for about a month.  At the time of my exam he was asymptomatic.  He did endorse intermittent headaches that improved with Tylenol however no vision changes, acute onset headaches, thunderclap in nature or debilitating headaches. Had a nonfocal neurologic exam.  Low index of suspicion for acute subarachnoid hemorrhage however he was sent from urgent care and told that he needed a CT and now he is concerned about a CT scan of his brain. Lab work was ordered from triage to assess for other evidence of endorgan damage. No evidence of CVA on my exam. No CP or SHOB that would suggest ACS. EKG as above without evidence of acute ischemic changes. No previous to compare to. No indication for cardiac biomarkers at this time.  Anticipate CT head will be within normal limits.  I ordered for p.o. doses of his home dose amlodipine, a single dose of HCTZ.  Most importantly he will need to schedule a follow-up appointment with a primary care doctor to get control of his hypertension.  CT head without any intracranial abnormality. Prescribed both amlodipine and HCTZ to start taking once daily.  He understands the importance of taking these medications to prevent further disease.  He will schedule an appointment with a primary care doctor for follow-up.  Final Clinical Impression(s) / ED Diagnoses Final diagnoses:  Hypertension, unspecified type  Primary hypertension    Rx / DC Orders ED Discharge Orders          Ordered    amLODipine (NORVASC) 10 MG tablet  Daily        06/03/21 2114    hydrochlorothiazide (HYDRODIURIL) 25 MG tablet  Daily        06/03/21 2114             Pearson Grippe, DO 06/03/21 2116    Varney Biles, MD 06/04/21 2303

## 2021-06-03 NOTE — Discharge Instructions (Addendum)
Your blood pressure was very high while in the emergency department.  However, your blood work and CT scan of your brain did not show any active disease or abnormalities.  It is very important that you start taking your blood pressure medication as prescribed.  I have given you 2 prescriptions that I want you to fill and start taking as prescribed.  Please schedule an appointment with a primary care doctor to follow-up on your high blood pressure.

## 2021-06-03 NOTE — ED Provider Notes (Signed)
Emergency Medicine Provider Triage Evaluation Note  David Pennington , a 55 y.o. male  was evaluated in triage.  Pt sent from urgent care for hypertensive urgency.    Patient had gone to urgent care for wound check.  Given that he was notably hypotensive, they inquired about dizziness or headache symptoms.  He states that yesterday he had left-sided chest discomfort and confusion in addition to dizziness x1 week.  He denies any symptoms at present aside from mild lightheadedness.  States that he does not see anyone to manage his blood pressure.    BP 08/2020 was 174/103.    Review of Systems  Positive: Lightheadedness. Negative: Chest pain, SOB, N/V  Physical Exam  BP (!) 216/104   Pulse 61   Temp 98.6 F (37 C) (Oral)   Resp 16   SpO2 99%  Gen:   Awake, no distress   Resp:  Normal effort  MSK:   Moves extremities without difficulty  Other:    Medical Decision Making  Medically screening exam initiated at 4:56 PM.  Appropriate orders placed.  David Pennington was informed that the remainder of the evaluation will be completed by another provider, this initial triage assessment does not replace that evaluation, and the importance of remaining in the ED until their evaluation is complete.     Corena Herter, PA-C 06/03/21 1656    Lennice Sites, DO 06/03/21 2056

## 2021-06-03 NOTE — ED Notes (Signed)
Discharge instructions reviewed and explained, pt verbalized understanding.

## 2021-06-03 NOTE — Discharge Instructions (Addendum)
You have severely elevated blood pressure and are in need of a higher level of care than we can provide in the urgent care setting. You have refused transport to the hospital by ambulance and therefore I will ask that you have your niece drive you the emergency room now for consideration of laboratory studies, head CT scan, starting blood pressure medication and monitoring. Unfortunately, we are not able to do this safely given your chest pain, headache, confusion, and blood pressure readings of 761O systolic. We have given you Tylenol here for your headache but emphasize the need to have an ER visit for your severely elevated blood pressure and worrisome symptoms.   Otherwise, keep your wound covered, changing your dressing 2-3 times daily until it completely scabs over. Every time you change your dressing, clean your wound with warm soapy water. Do not apply ointments, creams, peroxide. It will likely take ~2 weeks to completely heal.

## 2021-06-23 ENCOUNTER — Encounter: Payer: Self-pay | Admitting: Student

## 2022-01-24 IMAGING — CT CT HEAD W/O CM
4 series · 17 of 47 positions shown, 19 images · non-contrast
Comparison: None.

CLINICAL DATA: Dizziness, nonspecific

EXAM:
CT HEAD WITHOUT CONTRAST
TECHNIQUE: Contiguous axial images were obtained from the base of the skull
through the vertex without intravenous contrast.

[Series 3: head wo · axial · 0.44mm/px · z∈[-155,-20]mm · 7 of 37 slices shown, 9 images]
[im 5/37  brain]
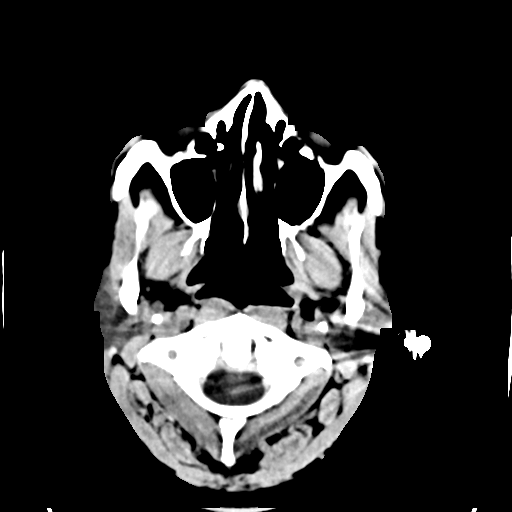
[im 5/37  bone]
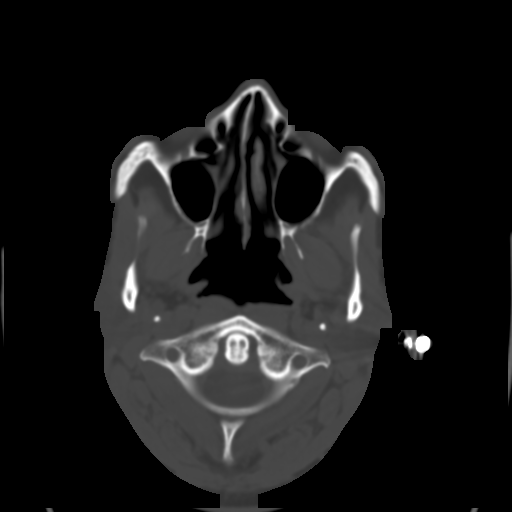
[im 10/37  brain]
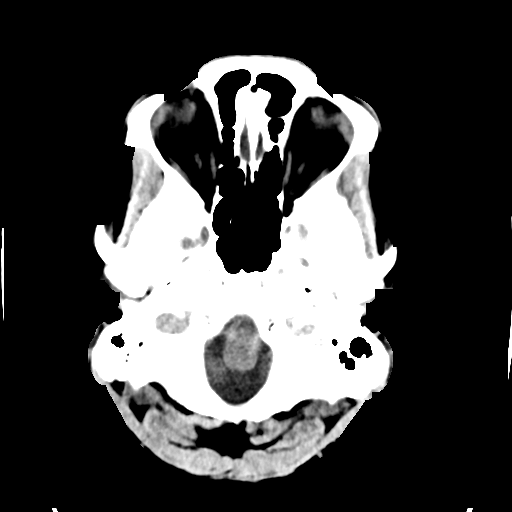
[im 14/37  brain]
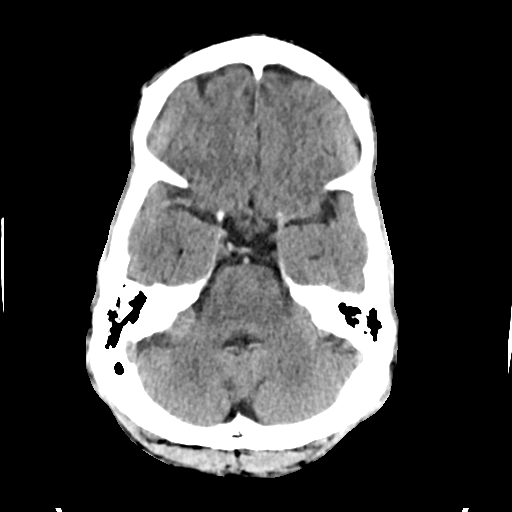
[im 19/37  brain]
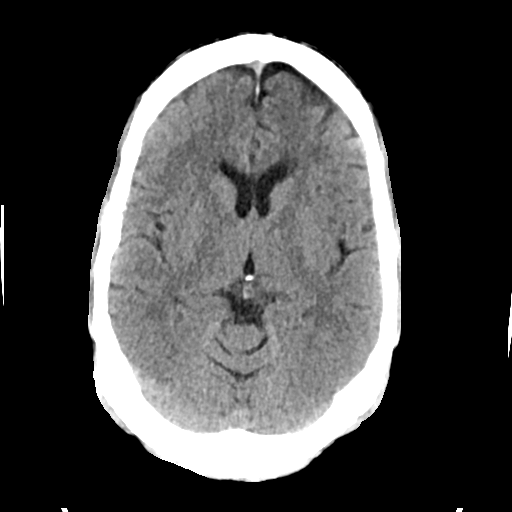
[im 23/37  brain]
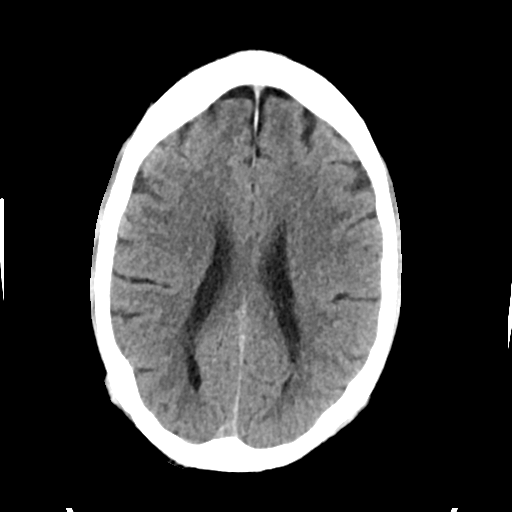
[im 23/37  bone]
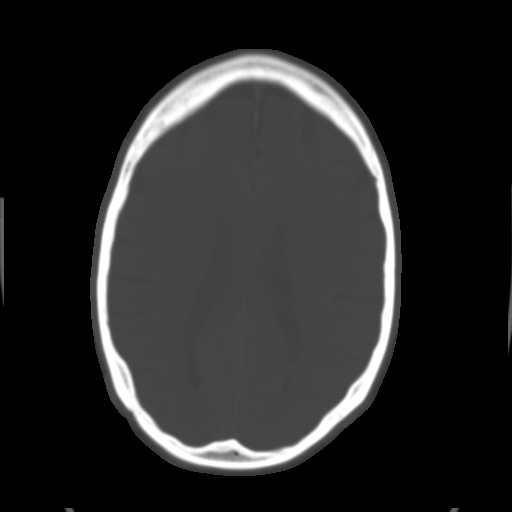
[im 28/37  brain]
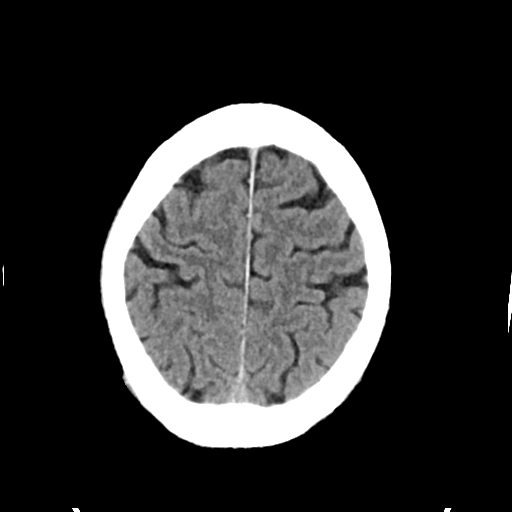
[im 32/37  brain]
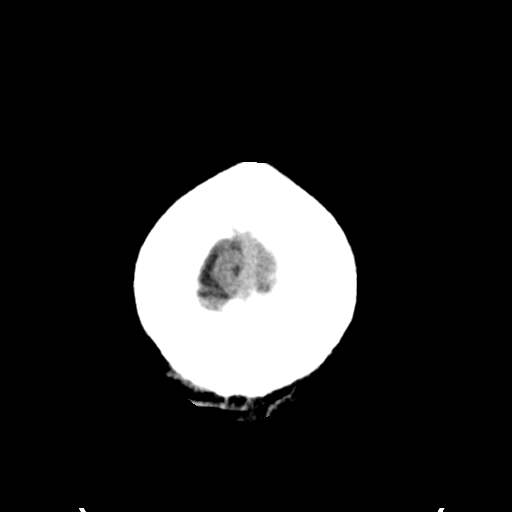

[Series 4: head bone · axial · 0.44mm/px · z∈[-157,-95]mm · 4 of 91 slices shown]
[im 10/91  bone]
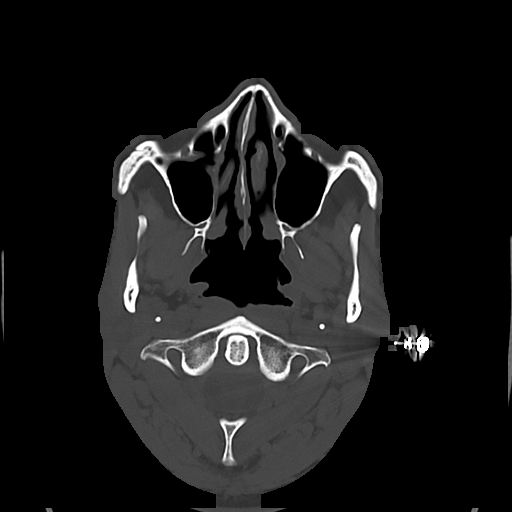
[im 19/91  bone]
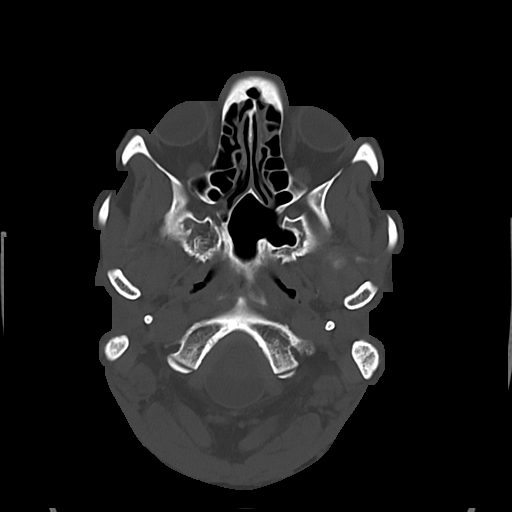
[im 28/91  bone]
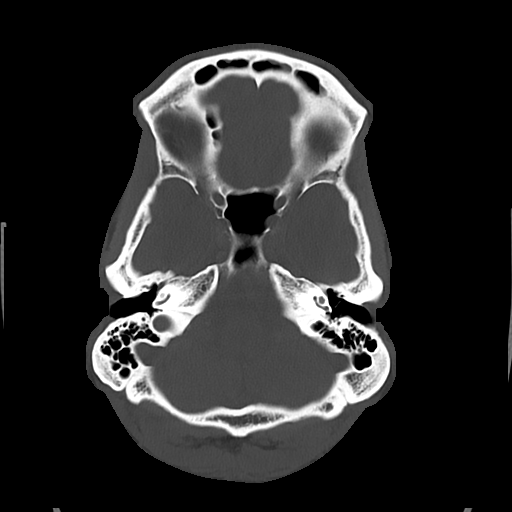
[im 41/91  bone]
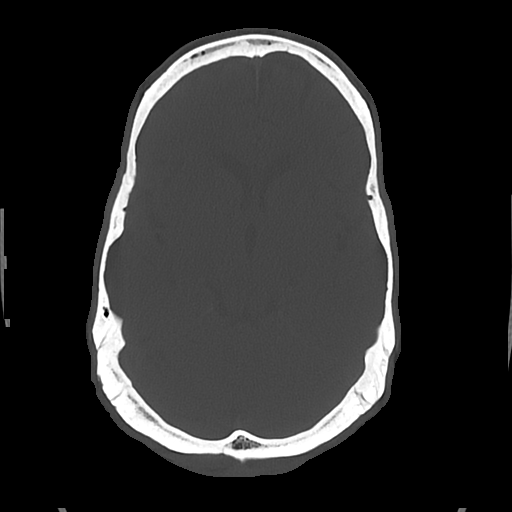

[Series 5: cor soft · coronal · 0.34mm/px · 3 of 71 slices shown]
[im 24/71  brain]
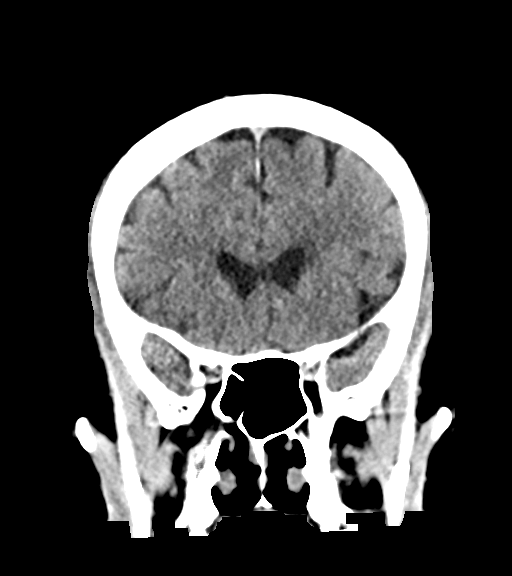
[im 32/71  brain]
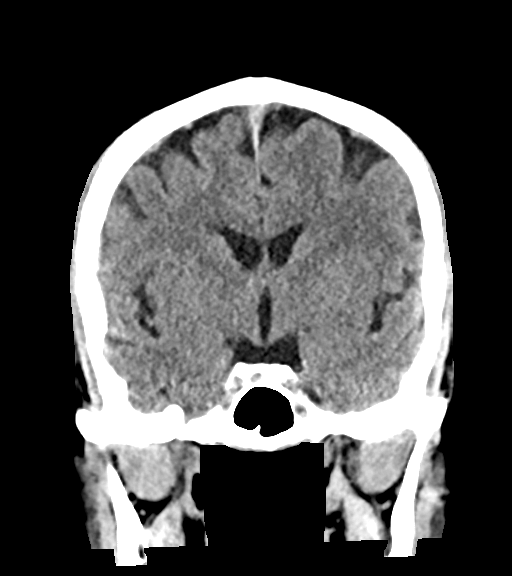
[im 39/71  brain]
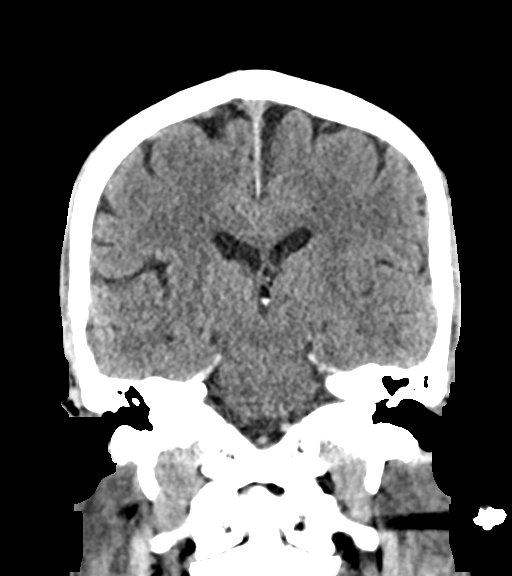

[Series 6: sag soft · sagittal · 0.39mm/px · 3 of 59 slices shown]
[im 20/59  brain]
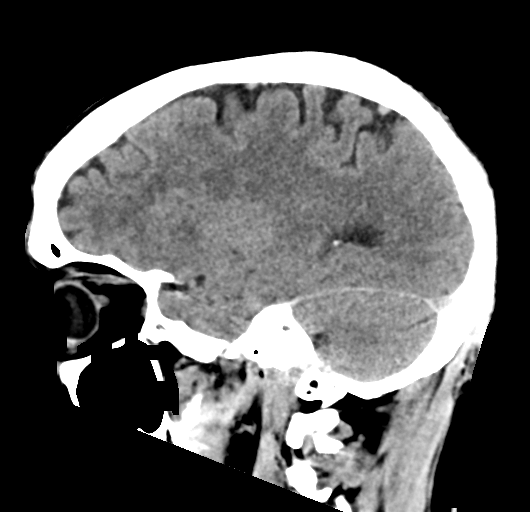
[im 30/59  brain]
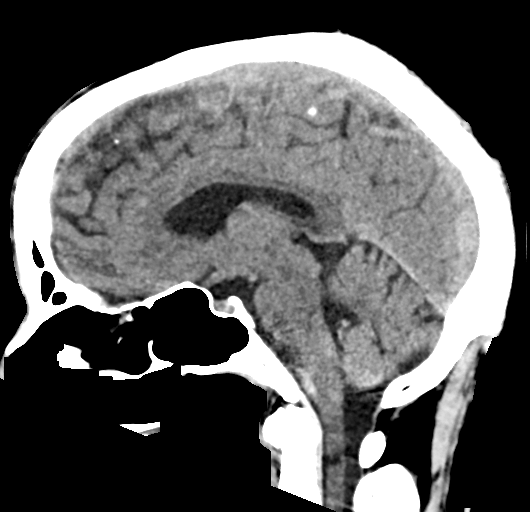
[im 39/59  brain]
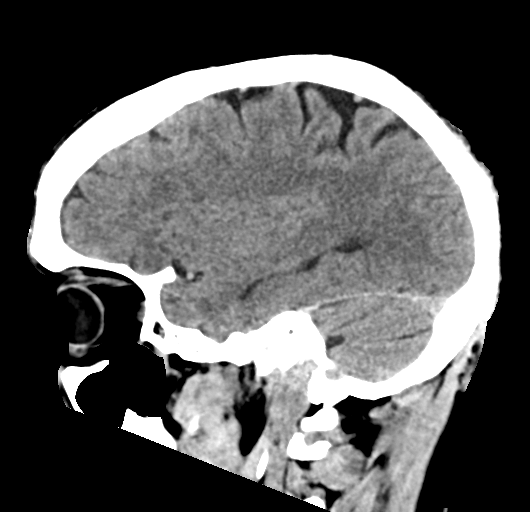

[17 of 47 positions shown; findings below may reference images not displayed]

FINDINGS: Brain: No evidence of acute infarction, hemorrhage, hydrocephalus,
extra-axial collection, visible mass lesion or mass effect.

Vascular: Atherosclerotic calcification of the carotid siphons. No
hyperdense vessel.

Skull: No calvarial fracture or suspicious osseous lesion. No scalp
swelling or hematoma.

Sinuses/Orbits: Paranasal sinuses and mastoid air cells are
predominantly clear. Included orbital structures are unremarkable.

Other: None.
IMPRESSION: No acute intracranial abnormality.

## 2022-05-21 ENCOUNTER — Encounter (HOSPITAL_COMMUNITY): Payer: Self-pay

## 2022-05-21 ENCOUNTER — Ambulatory Visit (HOSPITAL_COMMUNITY)
Admission: EM | Admit: 2022-05-21 | Discharge: 2022-05-21 | Disposition: A | Discharge status: Home / Self Care | Payer: Self-pay | Attending: Physician Assistant | Admitting: Physician Assistant

## 2022-05-21 DIAGNOSIS — Z789 Other specified health status: Secondary | ICD-10-CM

## 2022-05-21 DIAGNOSIS — I10 Essential (primary) hypertension: Secondary | ICD-10-CM

## 2022-05-21 DIAGNOSIS — R519 Headache, unspecified: Secondary | ICD-10-CM

## 2022-05-21 MED ORDER — IBUPROFEN 600 MG PO TABS: 600.0000 mg | ORAL_TABLET | Freq: Three times a day (TID) | ORAL | 0 refills | Status: DC | PRN

## 2022-05-21 NOTE — ED Provider Notes (Signed)
[place]    CSN: [id number] Arrival date & time: 05/21/22  1012      History   Chief Complaint Chief Complaint  Patient presents with   Headache    HPI David Pennington is a 56 y.o. male.   D89-year-old male presents with headaches.  Patient relates for the past 3 days he has had intermittent headaches, these are usually frontal or temporal bilaterally, they are throbbing.  Patient relates that they usually last around 45 minutes to an hour, he takes Tylenol and does get relief.  Patient relates he has been having several headaches a day.  Patient relates he has not had any vision changes, or nausea with the headaches.  Patient does relate that he has some mild dizziness when he has a headache but this tends to resolve.  Patient relates he does not have any balance problems and no unusual weakness of the upper or lower extremities.  Patient does indicate that he drinks a considerable amount of caffeine, 316 ounce cups of caffeinated products in the morning, he drinks tea throughout the day, and also drinks caffeinated colas throughout the day.  Patient relates he does smoke about 1/2 pack a day, these are new ports.  Patient relates he is not under any increased stress at the present time.  Patient relates he is taking his medicine on a regular basis.   Headache   Past Medical History:  Diagnosis Date   Hypertension     There are no problems to display for this patient.   Past Surgical History:  Procedure Laterality Date   NO PAST SURGERIES         Home Medications    Prior to Admission medications   Medication Sig Start Date End Date Taking? Authorizing Provider  amLODipine (NORVASC) 10 MG tablet Take 1 tablet (10 mg total) by mouth daily. 06/03/21  Yes Pearson Grippe, DO  hydrochlorothiazide (HYDRODIURIL) 25 MG tablet Take 1 tablet (25 mg total) by mouth daily. 06/03/21 05/21/22 Yes Pearson Grippe, DO  ibuprofen (ADVIL) 600 MG tablet Take 1 tablet (600  mg total) by mouth every 8 (eight) hours as needed. Take 1 tablet as needed to treat headache, no more than 3 a day and no more than 1 every 8 hours. 05/21/22  Yes Nyoka Lint, PA-C  omeprazole (PRILOSEC) 20 MG capsule Take 1 capsule (20 mg total) by mouth daily. Patient not taking: Reported on 06/03/2021 10/19/20 06/03/21  Gildardo Pounds, NP    Family History Family History  Problem Relation Age of Onset   Hypertension Sister    Hypertension Brother    Hypertension Sister     Social History Social History   Tobacco Use   Smoking status: Every Day    Packs/day: 1.00    Types: Cigarettes   Smokeless tobacco: Never  Substance Use Topics   Alcohol use: Yes    Comment: occasionally   Drug use: No     Allergies   Patient has no known allergies.   Review of Systems Review of Systems  Neurological:  Positive for headaches.     Physical Exam Triage Vital Signs ED Triage Vitals  Enc Vitals Group     BP 05/21/22 1035 (!) 192/92     Pulse Rate 05/21/22 1035 (!) 56     Resp 05/21/22 1035 16     Temp 05/21/22 1035 98 F (36.7 C)     Temp Source 05/21/22 1035 Oral     SpO2 05/21/22  1035 98 %     Weight 05/21/22 1036 180 lb (81.6 kg)     Height 05/21/22 1036 '6\' 5"'$  (1.956 m)     Head Circumference --      Peak Flow --      Pain Score 05/21/22 1036 8     Pain Loc --      Pain Edu? --      Excl. in [place]? --    No data found.  Updated Vital Signs BP (!) 192/92 (BP Location: Left Arm)   Pulse (!) 56   Temp 98 F (36.7 C) (Oral)   Resp 16   Ht '6\' 5"'$  (1.956 m)   Wt 180 lb (81.6 kg)   SpO2 98%   BMI 21.34 kg/m   Visual Acuity Right Eye Distance:   Left Eye Distance:   Bilateral Distance:    Right Eye Near:   Left Eye Near:    Bilateral Near:     Physical Exam Constitutional:      Appearance: He is well-developed.  HENT:     Right Ear: Tympanic membrane and ear canal normal.     Left Ear: Tympanic membrane and ear canal normal.     Mouth/Throat:     Mouth:  Mucous membranes are moist.     Pharynx: Oropharynx is clear. Uvula midline.  Eyes:     General: Lids are normal.     Extraocular Movements: Extraocular movements intact.  Cardiovascular:     Rate and Rhythm: Normal rate and regular rhythm.     Heart sounds: Normal heart sounds.  Pulmonary:     Effort: Pulmonary effort is normal.     Breath sounds: Normal breath sounds and air entry. No wheezing, rhonchi or rales.  Lymphadenopathy:     Cervical: No cervical adenopathy.  Neurological:     Mental Status: He is alert.      UC Treatments / Results  Labs (all labs ordered are listed, but only abnormal results are displayed) Labs Reviewed - No data to display  EKG   Radiology No results found.  Procedures Procedures (including critical care time)  Medications Ordered in UC Medications - No data to display  Initial Impression / Assessment and Plan / UC Course  I have reviewed the triage vital signs and the nursing notes.  Pertinent labs & imaging results that were available during my care of the patient were reviewed by me and considered in my medical decision making (see chart for details).    Plan: 1.  Patient advised to take ibuprofen 600 mg at onset of headache. 2.  Patient advised to reduce caffeine intake by half. 3.  Patient advised to reduce smoking by 10 to 15% 4.  Patient advised to follow-up PCP or return to urgent care if symptoms fail to improve. Final Clinical Impressions(s) / UC Diagnoses   Final diagnoses:  Headache disorder  Primary hypertension  Excessive caffeine intake     Discharge Instructions      Advised patient to take ibuprofen 600 mg 1 as needed for the headache, no more than 1 every 8 hours. Advised to reduce caffeine intake by half to see if this reduces the frequency of the headaches. Advised to reduce the number of cigarettes smoked per day by at least 25%. Advised to follow-up PCP or return to urgent care if symptoms fail to  improve    ED Prescriptions     Medication Sig Dispense Auth. Provider   ibuprofen (ADVIL) 600 MG  tablet Take 1 tablet (600 mg total) by mouth every 8 (eight) hours as needed. Take 1 tablet as needed to treat headache, no more than 3 a day and no more than 1 every 8 hours. 30 tablet Nyoka Lint, PA-C      PDMP not reviewed this encounter.   Nyoka Lint, PA-C 05/21/22 1121

## 2022-05-21 NOTE — ED Triage Notes (Signed)
Patient complaining of headache since Friday. Patient has history of high blood pressure.  Last took BP meds today.

## 2022-05-21 NOTE — Discharge Instructions (Addendum)
Advised patient to take ibuprofen 600 mg 1 as needed for the headache, no more than 1 every 8 hours. Advised to reduce caffeine intake by half to see if this reduces the frequency of the headaches. Advised to reduce the number of cigarettes smoked per day by at least 25%. Advised to follow-up PCP or return to urgent care if symptoms fail to improve

## 2023-04-15 ENCOUNTER — Ambulatory Visit (HOSPITAL_COMMUNITY)
Admission: EM | Admit: 2023-04-15 | Discharge: 2023-04-15 | Disposition: A | Discharge status: Home / Self Care | Payer: Commercial Managed Care - HMO | Attending: Family Medicine | Admitting: Family Medicine

## 2023-04-15 ENCOUNTER — Encounter (HOSPITAL_COMMUNITY): Payer: Self-pay

## 2023-04-15 DIAGNOSIS — I10 Essential (primary) hypertension: Secondary | ICD-10-CM | POA: Diagnosis present

## 2023-04-15 DIAGNOSIS — R519 Headache, unspecified: Secondary | ICD-10-CM | POA: Diagnosis present

## 2023-04-15 LAB — BASIC METABOLIC PANEL
Anion gap: 12 (ref 5–15)
BUN: 8 mg/dL (ref 6–20)
CO2: 27 mmol/L (ref 22–32)
Calcium: 9.3 mg/dL (ref 8.9–10.3)
Chloride: 100 mmol/L (ref 98–111)
Creatinine, Ser: 0.98 mg/dL (ref 0.61–1.24)
GFR, Estimated: >60 mL/min (ref >60)
Glucose, Bld: 89 mg/dL (ref 70–99)
Potassium: 3.3 mmol/L — ABNORMAL LOW (ref 3.5–5.1)
Sodium: 139 mmol/L (ref 135–145)

## 2023-04-15 MED ORDER — AMLODIPINE BESYLATE 10 MG PO TABS: 10.0000 mg | ORAL_TABLET | Freq: Every day | ORAL | 2 refills | Status: DC

## 2023-04-15 NOTE — ED Provider Notes (Signed)
[facility]    CSN: [id number] Arrival date & time: 04/15/23  1005      History   Chief Complaint Chief Complaint  Patient presents with   Headache    HPI David Pennington is a 57 y.o. male.    Headache  Here for h/a for some time, and hypertension.  He was previously on amlodipine 10 mg daily for hypertension.  Is been about a year since he took it.  No chest pain no shortness of breath.  No swelling.  He is currently unemployed and does not have a primary care doctor  Past Medical History:  Diagnosis Date   Hypertension     There are no problems to display for this patient.   Past Surgical History:  Procedure Laterality Date   NO PAST SURGERIES         Home Medications    Prior to Admission medications   Medication Sig Start Date End Date Taking? Authorizing Provider  amLODipine (NORVASC) 10 MG tablet Take 1 tablet (10 mg total) by mouth daily. 04/15/23   Zenia Resides, MD    Family History Family History  Problem Relation Age of Onset   Hypertension Sister    Hypertension Brother    Hypertension Sister     Social History Social History   Tobacco Use   Smoking status: Every Day    Packs/day: 1    Types: Cigarettes   Smokeless tobacco: Never  Substance Use Topics   Alcohol use: Yes    Comment: occasionally   Drug use: No     Allergies   Patient has no known allergies.   Review of Systems Review of Systems  Neurological:  Positive for headaches.     Physical Exam Triage Vital Signs ED Triage Vitals  Enc Vitals Group     BP 04/15/23 1021 (!) 223/104     Pulse Rate 04/15/23 1021 (!) 57     Resp 04/15/23 1021 16     Temp 04/15/23 1021 97.9 F (36.6 C)     Temp Source 04/15/23 1021 Oral     SpO2 04/15/23 1021 99 %     Weight 04/15/23 1020 180 lb (81.6 kg)     Height 04/15/23 1020 6\' 5"  (1.956 m)     Head Circumference --      Peak Flow --      Pain Score 04/15/23 1020 6     Pain Loc --      Pain Edu? --       Excl. in [place]? --    No data found.  Updated Vital Signs BP (!) 223/104 (BP Location: Left Arm)   Pulse (!) 57   Temp 97.9 F (36.6 C) (Oral)   Resp 16   Ht 6\' 5"  (1.956 m)   Wt 81.6 kg   SpO2 99%   BMI 21.34 kg/m   Visual Acuity Right Eye Distance:   Left Eye Distance:   Bilateral Distance:    Right Eye Near:   Left Eye Near:    Bilateral Near:     Physical Exam Vitals reviewed.  Constitutional:      General: He is not in acute distress.    Appearance: He is not ill-appearing, toxic-appearing or diaphoretic.  HENT:     Nose: Nose normal.     Mouth/Throat:     Mouth: Mucous membranes are moist.  Eyes:     Extraocular Movements: Extraocular movements intact.     Conjunctiva/sclera: Conjunctivae  normal.     Pupils: Pupils are equal, round, and reactive to light.  Cardiovascular:     Rate and Rhythm: Normal rate and regular rhythm.     Heart sounds: No murmur heard. Pulmonary:     Effort: Pulmonary effort is normal.     Breath sounds: Normal breath sounds. No rales.  Musculoskeletal:     Cervical back: Neck supple.     Right lower leg: No edema.     Left lower leg: No edema.  Lymphadenopathy:     Cervical: No cervical adenopathy.  Skin:    Coloration: Skin is not jaundiced or pale.  Neurological:     General: No focal deficit present.     Mental Status: He is alert and oriented to person, place, and time.  Psychiatric:        Behavior: Behavior normal.      UC Treatments / Results  Labs (all labs ordered are listed, but only abnormal results are displayed) Labs Reviewed  BASIC METABOLIC PANEL    EKG   Radiology No results found.  Procedures Procedures (including critical care time)  Medications Ordered in UC Medications - No data to display  Initial Impression / Assessment and Plan / UC Course  I have reviewed the triage vital signs and the nursing notes.  Pertinent labs & imaging results that were available during my care of the patient  were reviewed by me and considered in my medical decision making (see chart for details).     BMP is drawn today to assess kidney function and electrolytes.  Will notify him if anything is significantly abnormal  Amlodipine is sent in for a month supply +2 refills.  Assistance is requested to help him find a PCP  Final Clinical Impressions(s) / UC Diagnoses   Final diagnoses:  Essential hypertension, benign  Headache due to hypertension     Discharge Instructions       Take amlodipine 10 mg--1 daily for blood pressure  We have drawn blood to check your sugar, sodium and potassium, kidney function numbers.  Staff will notify you if anything is significantly abnormal  You can use the QR code/website at the back of the summary paperwork to schedule yourself a new patient appointment with primary care  Is important for you to establish with primary care office so that they can help you with your blood pressure treatment     ED Prescriptions     Medication Sig Dispense Auth. Provider   amLODipine (NORVASC) 10 MG tablet Take 1 tablet (10 mg total) by mouth daily. 30 tablet Saabir Blyth, Janace Aris, MD      PDMP not reviewed this encounter.   Zenia Resides, MD 04/15/23 [phone]

## 2023-04-15 NOTE — Discharge Instructions (Signed)
  Take amlodipine 10 mg--1 daily for blood pressure  We have drawn blood to check your sugar, sodium and potassium, kidney function numbers.  Staff will notify you if anything is significantly abnormal  You can use the QR code/website at the back of the summary paperwork to schedule yourself a new patient appointment with primary care  Is important for you to establish with primary care office so that they can help you with your blood pressure treatment

## 2023-04-15 NOTE — ED Triage Notes (Signed)
Patient here today with c/o HA X 3 days. He used to be on blood pressure medication but he ran out about a year ago because he didn't have a doctor.

## 2023-04-16 ENCOUNTER — Telehealth (HOSPITAL_COMMUNITY): Payer: Self-pay | Admitting: Emergency Medicine

## 2023-04-16 MED ORDER — POTASSIUM CHLORIDE CRYS ER 20 MEQ PO TBCR: 20.0000 meq | EXTENDED_RELEASE_TABLET | Freq: Every day | ORAL | 0 refills | Status: AC

## 2023-10-26 ENCOUNTER — Encounter (HOSPITAL_COMMUNITY): Payer: Self-pay | Admitting: Emergency Medicine

## 2023-10-26 ENCOUNTER — Ambulatory Visit (HOSPITAL_COMMUNITY)
Admission: EM | Admit: 2023-10-26 | Discharge: 2023-10-26 | Disposition: A | Discharge status: Home / Self Care | Payer: Managed Care, Other (non HMO) | Attending: Physician Assistant | Admitting: Physician Assistant

## 2023-10-26 DIAGNOSIS — S39012A Strain of muscle, fascia and tendon of lower back, initial encounter: Secondary | ICD-10-CM

## 2023-10-26 DIAGNOSIS — I10 Essential (primary) hypertension: Secondary | ICD-10-CM

## 2023-10-26 DIAGNOSIS — M545 Low back pain, unspecified: Secondary | ICD-10-CM

## 2023-10-26 MED ORDER — LIDOCAINE 5 % EX PTCH: 1.0000 | MEDICATED_PATCH | CUTANEOUS | 0 refills | Status: AC

## 2023-10-26 MED ORDER — METHOCARBAMOL 500 MG PO TABS: 500.0000 mg | ORAL_TABLET | Freq: Two times a day (BID) | ORAL | 0 refills | Status: AC

## 2023-10-26 MED ORDER — AMLODIPINE BESYLATE 10 MG PO TABS: 10.0000 mg | ORAL_TABLET | Freq: Every day | ORAL | 2 refills | Status: AC

## 2023-10-26 NOTE — Discharge Instructions (Signed)
Your blood pressure is very elevated.  Start amlodipine daily.  Someone should call you to schedule an appoint with primary care within the next week.  If you cannot see them within 1 to 2 weeks please return here for recheck as we can adjust your medication.  Avoid decongestants, caffeine, sodium, NSAIDs (aspirin, ibuprofen/Advil, naproxen/Aleve).  If you develop any chest pain, shortness of breath, headache, vision change, dizziness in the setting of high blood pressure you should be seen immediately.  I believe that you have injured a muscle in your back.  Use Robaxin up to twice a day.  This will make you sleepy so do not drive or drink alcohol while taking this.  Use lidocaine patch during the day.  Remove this at night.  Use only 1 patch per 24 hours.  If your symptoms are not improving quickly please follow-up with sports medicine; call to schedule an appointment.  If anything worsens and you have difficulty going to the bathroom or going to the bathroom on yourself without noticing it, weakness in your legs, numbness or tingling in your legs you need to be seen immediately.

## 2023-10-26 NOTE — ED Provider Notes (Signed)
[facility]    CSN: [id number] Arrival date & time: 10/26/23  1254      History   Chief Complaint Chief Complaint  Patient presents with   Headache    HPI David Pennington is a 57 y.o. male.   Patient presents today with a 3-day history of left lower back pain.  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  He does report that he was at work bending when the symptoms began.  Since then he has had ongoing pain that is worse with changing positions from sitting to standing or lying to sitting.  Pain is rated 7 on a 0-10 pain scale, described as sharp, no aggravating or alleviating factors notified.  He has not tried any over-the-counter medication for symptom management.  Denies any bowel/bladder incontinence, lower extremity weakness, saddle anesthesia.  He has no personal history of malignancy.  Blood pressure is noted to be very elevated today.  Initially nursing staff wrote that he was having a headache but when I asked him about this he reports that he has had intermittent headaches but is currently asymptomatic.  He has not had his blood pressure medication in several months.  He does not currently have a primary care provider but is open to establishing with someone.  He denies any current headache, chest pain, shortness of breath, visual disturbance, dizziness.    Past Medical History:  Diagnosis Date   Hypertension     There are no problems to display for this patient.   Past Surgical History:  Procedure Laterality Date   NO PAST SURGERIES         Home Medications    Prior to Admission medications   Medication Sig Start Date End Date Taking? Authorizing Provider  lidocaine (LIDODERM) 5 % Place 1 patch onto the skin daily. Remove & Discard patch within 12 hours or as directed by MD 10/26/23  Yes Evie Crumpler, Denny Peon K, PA-C  methocarbamol  (ROBAXIN) 500 MG tablet Take 1 tablet (500 mg total) by mouth 2 (two) times daily. 10/26/23  Yes Andjela Wickes K, PA-C  amLODipine (NORVASC) 10 MG tablet Take 1 tablet (10 mg total) by mouth daily. 10/26/23   Clayton Jarmon, Denny Peon K, PA-C  potassium chloride SA (KLOR-CON M) 20 MEQ tablet Take 1 tablet (20 mEq total) by mouth daily for 2 doses. 04/16/23 04/18/23  LampteyBritta Mccreedy, MD    Family History Family History  Problem Relation Age of Onset   Hypertension Sister    Hypertension Sister    Hypertension Brother     Social History Social History   Tobacco Use   Smoking status: Every Day    Current packs/day: 1.00    Types: Cigarettes   Smokeless tobacco: Never  Vaping Use   Vaping status: Never Used  Substance Use Topics   Alcohol use: Yes    Comment: occasionally   Drug use: No     Allergies   Patient has no known allergies.   Review of Systems Review of Systems  Constitutional:  Positive for activity change. Negative for appetite change, fatigue and fever.  Eyes:  Negative for visual disturbance.  Respiratory:  Negative for shortness of breath.   Cardiovascular:  Negative for chest pain, palpitations and leg swelling.  Gastrointestinal:  Negative for abdominal pain, diarrhea, nausea and vomiting.  Musculoskeletal:  Positive for back pain. Negative  for arthralgias and myalgias.  Neurological:  Negative for dizziness, weakness, light-headedness, numbness and headaches.     Physical Exam Triage Vital Signs ED Triage Vitals  Encounter Vitals Group     BP 10/26/23 1510 (!) 183/93     Systolic BP Percentile --      Diastolic BP Percentile --      Pulse Rate 10/26/23 1510 (!) 53     Resp 10/26/23 1510 16     Temp 10/26/23 1510 98.1 F (36.7 C)     Temp Source 10/26/23 1510 Oral     SpO2 10/26/23 1510 98 %     Weight 10/26/23 1511 180 lb (81.6 kg)     Height 10/26/23 1511 6\' 5"  (1.956 m)     Head Circumference --      Peak Flow --      Pain Score 10/26/23 1511 7     Pain  Loc --      Pain Education --      Exclude from Growth Chart --    No data found.  Updated Vital Signs BP (!) 214/105 (BP Location: Right Arm)   Pulse (!) 54   Temp 98.1 F (36.7 C) (Oral)   Resp 16   Ht 6\' 5"  (1.956 m)   Wt 180 lb (81.6 kg)   SpO2 98%   BMI 21.34 kg/m   Visual Acuity Right Eye Distance:   Left Eye Distance:   Bilateral Distance:    Right Eye Near:   Left Eye Near:    Bilateral Near:     Physical Exam Vitals reviewed.  Constitutional:      General: He is awake.     Appearance: Normal appearance. He is well-developed. He is not ill-appearing.     Comments: Very pleasant male appears stated age in no acute distress sitting comfortably in exam room  HENT:     Head: Normocephalic and atraumatic.  Cardiovascular:     Rate and Rhythm: Normal rate and regular rhythm.     Heart sounds: Normal heart sounds, S1 normal and S2 normal. No murmur heard. Pulmonary:     Effort: Pulmonary effort is normal.     Breath sounds: Normal breath sounds. No stridor. No wheezing, rhonchi or rales.     Comments: Clear to auscultation bilaterally Abdominal:     Palpations: Abdomen is soft.     Tenderness: There is no abdominal tenderness.  Musculoskeletal:     Cervical back: No tenderness or bony tenderness.     Thoracic back: No tenderness or bony tenderness.     Lumbar back: Tenderness present. No bony tenderness. Negative right straight leg raise test and negative left straight leg raise test.     Comments: Back: Tender to palpation of left lumbar paraspinal muscles.  No pain percussion of vertebrae.  No deformity or step-off noted.  Negative straight leg raise bilaterally.  Negative Faber bilaterally.  Strength 5/5 bilateral lower extremities.  Neurological:     Mental Status: He is alert.  Psychiatric:        Behavior: Behavior is cooperative.      UC Treatments / Results  Labs (all labs ordered are listed, but only abnormal results are displayed) Labs Reviewed  - No data to display  EKG   Radiology No results found.  Procedures Procedures (including critical care time)  Medications Ordered in UC Medications - No data to display  Initial Impression / Assessment and Plan / UC Course  I have reviewed the  triage vital signs and the nursing notes.  Pertinent labs & imaging results that were available during my care of the patient were reviewed by me and considered in my medical decision making (see chart for details).     Patient is well-appearing, afebrile, nontoxic, nontachycardic.  He does significantly elevated blood pressure but continued to decline having any symptoms including headache, dizziness, chest pain, shortness of breath.  Will restart his amlodipine 10 mg but recommended that he follow-up closely with either our clinic or primary care.  He does not currently have a primary care provider so we will try to establish him with someone via PCP assistance.  If he cannot see them within 1 to 2 weeks he is to return here for recheck and medication adjustment.  Recommend that he avoid decongestants, caffeine, sodium, NSAIDs.  If he develops any chest pain, shortness of breath, headache, vision change, dizziness in the setting of high blood pressure he is to go to the emergency room to which he expressed understanding.  No indication for plain films as patient denies any recent trauma and has no focal bony tenderness.  No alarm symptoms that warrant going to the emergency room.  Given his elevated blood pressure reading we will avoid NSAIDs but he can use acetaminophen/Tylenol over-the-counter.  He was started on Robaxin twice daily and we discussed that this can be sedating so he is not to drive or drink alcohol with taking it.  He was also given lidocaine patches for additional symptom relief.  If his symptoms not improving quickly he is to follow-up with sports medicine and was given contact information for local provider.  We discussed alarm  symptoms that warrant emergent evaluation.  Strict return precautions given.  Work excuse note provided.  Final Clinical Impressions(s) / UC Diagnoses   Final diagnoses:  Acute left-sided low back pain without sciatica  Strain of lumbar region, initial encounter  Elevated blood pressure reading with diagnosis of hypertension     Discharge Instructions      Your blood pressure is very elevated.  Start amlodipine daily.  Someone should call you to schedule an appoint with primary care within the next week.  If you cannot see them within 1 to 2 weeks please return here for recheck as we can adjust your medication.  Avoid decongestants, caffeine, sodium, NSAIDs (aspirin, ibuprofen/Advil, naproxen/Aleve).  If you develop any chest pain, shortness of breath, headache, vision change, dizziness in the setting of high blood pressure you should be seen immediately.  I believe that you have injured a muscle in your back.  Use Robaxin up to twice a day.  This will make you sleepy so do not drive or drink alcohol while taking this.  Use lidocaine patch during the day.  Remove this at night.  Use only 1 patch per 24 hours.  If your symptoms are not improving quickly please follow-up with sports medicine; call to schedule an appointment.  If anything worsens and you have difficulty going to the bathroom or going to the bathroom on yourself without noticing it, weakness in your legs, numbness or tingling in your legs you need to be seen immediately.     ED Prescriptions     Medication Sig Dispense Auth. Provider   amLODipine (NORVASC) 10 MG tablet Take 1 tablet (10 mg total) by mouth daily. 30 tablet Raidon Swanner K, PA-C   methocarbamol (ROBAXIN) 500 MG tablet Take 1 tablet (500 mg total) by mouth 2 (two) times daily. 20  tablet Rileigh Kawashima K, PA-C   lidocaine (LIDODERM) 5 % Place 1 patch onto the skin daily. Remove & Discard patch within 12 hours or as directed by MD 14 patch Shay Bartoli K, PA-C       PDMP not reviewed this encounter.   Jeani Hawking, PA-C 10/26/23 1614

## 2023-10-26 NOTE — ED Triage Notes (Signed)
Patient c/o headaches x 3 days along with left sided flank pain.  Both started the same day.  Patient feels that the headaches are coming from his blood pressure.  Ran out of his BP meds on Tuesday.  Denies any urinary sx's, no hematuria.  Patient has taken Tylenol for his HA.
